# Patient Record
Sex: Female | Born: 1970 | Race: White | Hispanic: No | Marital: Married | Smoking: Never smoker
Health system: Southern US, Community
[De-identification: ages and names within clinical notes are randomized; demographics above are authoritative.]

## PROBLEM LIST (undated history)

## (undated) DIAGNOSIS — D649 Anemia, unspecified: Secondary | ICD-10-CM

## (undated) DIAGNOSIS — R42 Dizziness and giddiness: Secondary | ICD-10-CM

## (undated) DIAGNOSIS — R7303 Prediabetes: Secondary | ICD-10-CM

## (undated) DIAGNOSIS — F419 Anxiety disorder, unspecified: Secondary | ICD-10-CM

## (undated) DIAGNOSIS — F329 Major depressive disorder, single episode, unspecified: Secondary | ICD-10-CM

## (undated) DIAGNOSIS — F32A Depression, unspecified: Secondary | ICD-10-CM

## (undated) DIAGNOSIS — N920 Excessive and frequent menstruation with regular cycle: Secondary | ICD-10-CM

## (undated) DIAGNOSIS — Z8041 Family history of malignant neoplasm of ovary: Secondary | ICD-10-CM

## (undated) DIAGNOSIS — D259 Leiomyoma of uterus, unspecified: Secondary | ICD-10-CM

## (undated) HISTORY — DX: Excessive and frequent menstruation with regular cycle: N92.0

## (undated) HISTORY — DX: Dizziness and giddiness: R42

## (undated) HISTORY — DX: Major depressive disorder, single episode, unspecified: F32.9

## (undated) HISTORY — DX: Family history of malignant neoplasm of ovary: Z80.41

## (undated) HISTORY — DX: Anxiety disorder, unspecified: F41.9

## (undated) HISTORY — PX: WISDOM TOOTH EXTRACTION: SHX21

## (undated) HISTORY — DX: Anemia, unspecified: D64.9

## (undated) HISTORY — DX: Depression, unspecified: F32.A

## (undated) HISTORY — DX: Leiomyoma of uterus, unspecified: D25.9

---
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
## 2004-10-05 ENCOUNTER — Ambulatory Visit: Payer: Self-pay | Admitting: Unknown Physician Specialty

## 2005-10-09 ENCOUNTER — Inpatient Hospital Stay: Payer: Self-pay

## 2006-09-12 HISTORY — PX: HYSTEROSCOPY WITH D & C: SHX1775

## 2007-02-26 ENCOUNTER — Ambulatory Visit: Payer: Self-pay | Admitting: Unknown Physician Specialty

## 2007-03-01 ENCOUNTER — Ambulatory Visit: Payer: Self-pay | Admitting: Unknown Physician Specialty

## 2009-10-07 DIAGNOSIS — G4700 Insomnia, unspecified: Secondary | ICD-10-CM | POA: Insufficient documentation

## 2009-10-07 DIAGNOSIS — F419 Anxiety disorder, unspecified: Secondary | ICD-10-CM | POA: Insufficient documentation

## 2010-01-21 DIAGNOSIS — E559 Vitamin D deficiency, unspecified: Secondary | ICD-10-CM | POA: Insufficient documentation

## 2010-12-06 ENCOUNTER — Ambulatory Visit: Payer: Self-pay | Admitting: Family Medicine

## 2013-04-02 LAB — HM PAP SMEAR: HM Pap smear: NEGATIVE

## 2013-04-26 HISTORY — PX: MM BREAST STEREO BIOPSY LEFT (ARMC HX): HXRAD1824

## 2013-05-27 DIAGNOSIS — R928 Other abnormal and inconclusive findings on diagnostic imaging of breast: Secondary | ICD-10-CM | POA: Insufficient documentation

## 2013-08-02 LAB — HM MAMMOGRAPHY

## 2013-09-12 HISTORY — PX: BREAST BIOPSY: SHX20

## 2015-03-11 DIAGNOSIS — D259 Leiomyoma of uterus, unspecified: Secondary | ICD-10-CM | POA: Insufficient documentation

## 2015-03-11 DIAGNOSIS — R51 Headache: Secondary | ICD-10-CM

## 2015-03-11 DIAGNOSIS — N921 Excessive and frequent menstruation with irregular cycle: Secondary | ICD-10-CM | POA: Insufficient documentation

## 2015-03-11 DIAGNOSIS — R739 Hyperglycemia, unspecified: Secondary | ICD-10-CM | POA: Insufficient documentation

## 2015-03-11 DIAGNOSIS — R519 Headache, unspecified: Secondary | ICD-10-CM | POA: Insufficient documentation

## 2015-07-18 ENCOUNTER — Ambulatory Visit (INDEPENDENT_AMBULATORY_CARE_PROVIDER_SITE_OTHER): Payer: BC Managed Care – PPO | Admitting: Family Medicine

## 2015-07-18 ENCOUNTER — Encounter: Payer: Self-pay | Admitting: Family Medicine

## 2015-07-18 VITALS — BP 128/74 | HR 84 | Temp 98.2°F | Resp 16 | Wt 167.0 lb

## 2015-07-18 DIAGNOSIS — J029 Acute pharyngitis, unspecified: Secondary | ICD-10-CM | POA: Diagnosis not present

## 2015-07-18 LAB — POCT RAPID STREP A (OFFICE): Rapid Strep A Screen: POSITIVE — AB

## 2015-07-18 MED ORDER — AMOXICILLIN 875 MG PO TABS: 875.0000 mg | ORAL_TABLET | Freq: Two times a day (BID) | ORAL | Status: DC

## 2015-07-18 NOTE — Progress Notes (Signed)
Subjective:     Patient ID: Allison Beard, female   DOB: 1971-05-28, 44 y.o.   MRN: [id number]  HPI  Chief Complaint  Patient presents with  . URI  . Sore Throat  . Ear Pain  States her sx started 11/3 associated with fever, chill, and PND. Report she is a [place].   Review of Systems     Objective:   Physical Exam  Constitutional: She appears well-developed and well-nourished. No distress.  Ears: T.M's intact without inflammation Throat: mild tonsillar enlargement and erythema Neck: +anterior cervical nodes Lungs: clear    Assessment:    1. Pharyngitis - POCT rapid strep A - amoxicillin (AMOXIL) 875 MG tablet; Take 1 tablet (875 mg total) by mouth 2 (two) times daily.  Dispense: 20 tablet; Refill: 0    Plan:    Discussed warm salt water gargles.

## 2015-07-18 NOTE — Patient Instructions (Signed)
May use warm salt water gargles

## 2015-07-22 ENCOUNTER — Ambulatory Visit (INDEPENDENT_AMBULATORY_CARE_PROVIDER_SITE_OTHER): Payer: BC Managed Care – PPO | Admitting: Physician Assistant

## 2015-07-22 ENCOUNTER — Encounter: Payer: Self-pay | Admitting: Physician Assistant

## 2015-07-22 VITALS — BP 110/70 | HR 74 | Temp 98.7°F | Resp 16 | Ht 63.0 in | Wt 167.8 lb

## 2015-07-22 DIAGNOSIS — Z1239 Encounter for other screening for malignant neoplasm of breast: Secondary | ICD-10-CM | POA: Diagnosis not present

## 2015-07-22 DIAGNOSIS — Z124 Encounter for screening for malignant neoplasm of cervix: Secondary | ICD-10-CM

## 2015-07-22 DIAGNOSIS — Z0000 Encounter for general adult medical examination without abnormal findings: Secondary | ICD-10-CM

## 2015-07-22 NOTE — Progress Notes (Signed)
Patient: Allison Beard, Female    DOB: [birth year], 44 y.o.   MRN: [id number] Visit Date: 07/22/2015  Today's Provider: Mar Daring, PA-C   Chief Complaint  Patient presents with  . Annual Exam   Subjective:    Annual physical exam Allison Beard is a 44 y.o. female who presents today for health maintenance and complete physical. She feels well. She reports exercising, walks 2-3 time a week. She reports she is sleeping well. Per patient skipped last year wellness exam due to financial constraints. Previous Pap smear was done at [facility] and reported normal. This was done in 2014. At that time she did have a Mirena IUD placed. She does not remember ever having any abnormal Pap smears. There is no family history of cervical or ovarian cancer. She would like to return to [place] for her Pap smear and breast exam. She was previously seen by Dr. Leo Grosser. In 2014 when she had her mammogram it did come back abnormal and she went through a full workup. This was done on the left breast. All workup came back negative. She has her mammograms done at [facility] imaging and would like to return there for her mammograms. There is no known family history of breast cancer. She has never had a colonoscopy. There is no known family history of colon cancer. Patient declined Influenza vaccine.  Last PCP:07/11/14  Review of Systems  Constitutional: Negative.   HENT: Negative.   Eyes: Negative.   Respiratory: Negative.   Cardiovascular: Negative.   Gastrointestinal: Negative.   Endocrine: Positive for heat intolerance.  Genitourinary: Negative.   Musculoskeletal: Negative.   Skin: Negative.   Allergic/Immunologic: Negative.   Neurological: Negative.   Hematological: Negative.   Psychiatric/Behavioral: Negative.     Social History She  reports that she has never smoked. She has never used smokeless tobacco. She reports that she drinks alcohol. She reports that  she does not use illicit drugs. Social History   Social History  . Marital Status: Married    Spouse Name: N/A  . Number of Children: N/A  . Years of Education: N/A   Social History Main Topics  . Smoking status: Never Smoker   . Smokeless tobacco: Never Used  . Alcohol Use: 0.0 oz/week    0 Standard drinks or equivalent per week     Comment: OCCASIONALLY  . Drug Use: No  . Sexual Activity: Not Asked   Other Topics Concern  . None   Social History Narrative    Patient Active Problem List   Diagnosis Date Noted  . Elevated blood sugar 03/11/2015  . Leiomyoma of uterus 03/11/2015  . Cephalalgia 03/11/2015  . Excess, menstruation 03/11/2015  . Abnormal finding on mammography 05/27/2013  . Avitaminosis D 01/21/2010  . Anxiety disorder 10/07/2009  . Cannot sleep 10/07/2009    Past Surgical History  Procedure Laterality Date  . Myomectomy  2009  . Ankle surgery  1989    Family History  Family Status  Relation Status Death Age  . Mother Deceased 27  . Father Alive   . Sister Alive    Her family history includes Bipolar disorder in her mother and sister; Congestive Heart Failure in her mother; Depression in her mother; Diabetes in her mother; Heart disease in her mother.    No Known Allergies  Previous Medications   AMOXICILLIN (AMOXIL) 875 MG TABLET    Take 1 tablet (875 mg total) by mouth 2 (two)  times daily.   FLUOXETINE (PROZAC) 40 MG CAPSULE    Take 1 capsule by mouth daily.   LEVONORGESTREL (MIRENA, 52 MG,) 20 MCG/24HR IUD    MIRENA, 20MCG/24HR (Intrauterine Intrauterine Device)  as directed for 0 days  Quantity: 1.00;  Refills: 0   Ordered :06-Dec-2010  Maurine Minister FNP;  Started 07-Oct-2009 Active    Patient Care Team: Mar Daring, PA-C as PCP - General (Physician Assistant)     Objective:   Vitals: BP 110/70 mmHg  Pulse 74  Temp(Src) 98.7 F (37.1 C) (Oral)  Resp 16  Ht 5\' 3"  (1.6 m)  Wt 167 lb 12.8 oz (76.114 kg)  BMI 29.73  kg/m2  LMP    Physical Exam  Constitutional: She is oriented to person, place, and time. She appears well-developed and well-nourished. No distress.  HENT:  Head: Normocephalic and atraumatic.  Right Ear: External ear normal.  Left Ear: External ear normal.  Nose: Nose normal.  Mouth/Throat: Oropharynx is clear and moist. No oropharyngeal exudate.  Eyes: Conjunctivae and EOM are normal. Pupils are equal, round, and reactive to light. Right eye exhibits no discharge. Left eye exhibits no discharge. No scleral icterus.  Neck: Normal range of motion. Neck supple. No JVD present. No tracheal deviation present. No thyromegaly present.  Cardiovascular: Normal rate, regular rhythm, normal heart sounds and intact distal pulses.  Exam reveals no gallop and no friction rub.   No murmur heard. Pulmonary/Chest: Effort normal and breath sounds normal. No respiratory distress. She has no wheezes. She has no rales. She exhibits no tenderness.  Abdominal: Soft. Bowel sounds are normal. She exhibits no distension and no mass. There is no tenderness. There is no rebound and no guarding.  Musculoskeletal: Normal range of motion. She exhibits no edema or tenderness.  Lymphadenopathy:    She has no cervical adenopathy.  Neurological: She is alert and oriented to person, place, and time.  Skin: Skin is warm and dry. No rash noted. She is not diaphoretic.  Psychiatric: She has a normal mood and affect. Her behavior is normal. Judgment and thought content normal.  Vitals reviewed.    Depression Screen No flowsheet data found.    Assessment & Plan:     Routine Health Maintenance and Physical Exam  1. Annual physical exam Physical exam was normal today. She did defer doing her breast exam and pelvic exam including Pap smear to [facility]. I did make the referral for her to go back to [facility] for this. She has not been seen since 2014 there. We will also check labs as below and follow-up pending  labs. I did also place an order for her mammogram. She will call [place] [facility] to schedule this appointment. I will follow-up with her pending labs. If labs are stable I will see her back in approximately 8 months. At that time she would like to discuss possibly trying Wellbutrin instead of Prozac to see if it will help decrease the side effects she has with the Prozac as well as offer some weight loss benefit. She would like to wait until this time so that she will be out of school that way if she has any adverse reactions to the Wellbutrin it will not be noticed through her employer. - Mammogram Digital Diagnostic Bilateral; Future - CBC with Differential - Comprehensive metabolic panel - Lipid panel - TSH  2. Breast cancer screening She does have a history of abnormal mammogram in 2014 of the left breast. At that time  she did undergo a diagnostic ultrasound, needle biopsy and surgical biopsy. All was normal. Orders have in place for screening digital mammogram as well as a diagnostic mammogram in case the screening digital mammogram comes back abnormal. She is going to be calling [facility] to schedule this appointment. I will follow-up with her pending the results. - Mammogram Digital Diagnostic Bilateral; Future - MM Digital Screening; Future  3. Encounter for screening for cervical cancer  Patient was previously seen at [facility] for her breast exams and pelvic exams. She does currently have an IUD in place. She would like to go back to [place] for her screening Pap smear and breast exam. I did place this referral for her and we'll follow-up once I receive their notes. - Ambulatory referral to Gynecology   Exercise Activities and Dietary recommendations Goals    None      Immunization History  Administered Date(s) Administered  . Tdap 07/26/2011    Health Maintenance  Topic Date Due  . HIV Screening  12/14/1983  . PAP SMEAR  12/13/1989  . INFLUENZA  VACCINE  04/13/2015  . TETANUS/TDAP  07/25/2021      Discussed health benefits of physical activity, and encouraged her to engage in regular exercise appropriate for her age and condition.    --------------------------------------------------------------------

## 2015-07-22 NOTE — Patient Instructions (Signed)
Health Maintenance, Female Adopting a healthy lifestyle and getting preventive care can go a long way to promote health and wellness. Talk with your health care provider about what schedule of regular examinations is right for you. This is a good chance for you to check in with your provider about disease prevention and staying healthy. In between checkups, there are plenty of things you can do on your own. Experts have done a lot of research about which lifestyle changes and preventive measures are most likely to keep you healthy. Ask your health care provider for more information. WEIGHT AND DIET  Eat a healthy diet  Be sure to include plenty of vegetables, fruits, low-fat dairy products, and lean protein.  Do not eat a lot of foods high in solid fats, added sugars, or salt.  Get regular exercise. This is one of the most important things you can do for your health.  Most adults should exercise for at least 150 minutes each week. The exercise should increase your heart rate and make you sweat (moderate-intensity exercise).  Most adults should also do strengthening exercises at least twice a week. This is in addition to the moderate-intensity exercise.  Maintain a healthy weight  Body mass index (BMI) is a measurement that can be used to identify possible weight problems. It estimates body fat based on height and weight. Your health care provider can help determine your BMI and help you achieve or maintain a healthy weight.  For females 46 years of age and older:   A BMI below 18.5 is considered underweight.  A BMI of 18.5 to 24.9 is normal.  A BMI of 25 to 29.9 is considered overweight.  A BMI of 30 and above is considered obese.  Watch levels of cholesterol and blood lipids  You should start having your blood tested for lipids and cholesterol at 46 years of age, then have this test every 5 years.  You may need to have your cholesterol levels checked more often if:  Your lipid  or cholesterol levels are high.  You are older than 46 years of age.  You are at high risk for heart disease.  CANCER SCREENING   Lung Cancer  Lung cancer screening is recommended for adults 75-66 years old who are at high risk for lung cancer because of a history of smoking.  A yearly low-dose CT scan of the lungs is recommended for people who:  Currently smoke.  Have quit within the past 15 years.  Have at least a 30-pack-year history of smoking. A pack year is smoking an average of one pack of cigarettes a day for 1 year.  Yearly screening should continue until it has been 46 years since you quit.  Yearly screening should stop if you develop a health problem that would prevent you from having lung cancer treatment.  Breast Cancer  Practice breast self-awareness. This means understanding how your breasts normally appear and feel.  It also means doing regular breast self-exams. Let your health care provider know about any changes, no matter how small.  If you are in your 20s or 30s, you should have a clinical breast exam (CBE) by a health care provider every 1-3 years as part of a regular health exam.  If you are 25 or older, have a CBE every year. Also consider having a breast X-ray (mammogram) every year.  If you have a family history of breast cancer, talk to your health care provider about genetic screening.  If you  are at high risk for breast cancer, talk to your health care provider about having an MRI and a mammogram every year.  Breast cancer gene (BRCA) assessment is recommended for women who have family members with BRCA-related cancers. BRCA-related cancers include:  Breast.  Ovarian.  Tubal.  Peritoneal cancers.  Results of the assessment will determine the need for genetic counseling and BRCA1 and BRCA2 testing. Cervical Cancer Your health care provider may recommend that you be screened regularly for cancer of the pelvic organs (ovaries, uterus, and  vagina). This screening involves a pelvic examination, including checking for microscopic changes to the surface of your cervix (Pap test). You may be encouraged to have this screening done every 3 years, beginning at age 46.  For women ages 46-65, health care providers may recommend pelvic exams and Pap testing every 3 years, or they may recommend the Pap and pelvic exam, combined with testing for human papilloma virus (HPV), every 5 years. Some types of HPV increase your risk of cervical cancer. Testing for HPV may also be done on women of any age with unclear Pap test results.  Other health care providers may not recommend any screening for nonpregnant women who are considered low risk for pelvic cancer and who do not have symptoms. Ask your health care provider if a screening pelvic exam is right for you.  If you have had past treatment for cervical cancer or a condition that could lead to cancer, you need Pap tests and screening for cancer for at least 20 years after your treatment. If Pap tests have been discontinued, your risk factors (such as having a new sexual partner) need to be reassessed to determine if screening should resume. Some women have medical problems that increase the chance of getting cervical cancer. In these cases, your health care provider may recommend more frequent screening and Pap tests. Colorectal Cancer  This type of cancer can be detected and often prevented.  Routine colorectal cancer screening usually begins at 46 years of age and continues through 46 years of age.  Your health care provider may recommend screening at an earlier age if you have risk factors for colon cancer.  Your health care provider may also recommend using home test kits to check for hidden blood in the stool.  A small camera at the end of a tube can be used to examine your colon directly (sigmoidoscopy or colonoscopy). This is done to check for the earliest forms of colorectal  cancer.  Routine screening usually begins at age 46.  Direct examination of the colon should be repeated every 5-10 years through 46 years of age. However, you may need to be screened more often if early forms of precancerous polyps or small growths are found. Skin Cancer  Check your skin from head to toe regularly.  Tell your health care provider about any new moles or changes in moles, especially if there is a change in a mole's shape or color.  Also tell your health care provider if you have a mole that is larger than the size of a pencil eraser.  Always use sunscreen. Apply sunscreen liberally and repeatedly throughout the day.  Protect yourself by wearing long sleeves, pants, a wide-brimmed hat, and sunglasses whenever you are outside. HEART DISEASE, DIABETES, AND HIGH BLOOD PRESSURE   High blood pressure causes heart disease and increases the risk of stroke. High blood pressure is more likely to develop in:  People who have blood pressure in the high end   of the normal range (130-139/85-89 mm Hg).  People who are overweight or obese.  People who are African American.  If you are 38-23 years of age, have your blood pressure checked every 3-5 years. If you are 61 years of age or older, have your blood pressure checked every year. You should have your blood pressure measured twice--once when you are at a hospital or clinic, and once when you are not at a hospital or clinic. Record the average of the two measurements. To check your blood pressure when you are not at a hospital or clinic, you can use:  An automated blood pressure machine at a pharmacy.  A home blood pressure monitor.  If you are between 45 years and 39 years old, ask your health care provider if you should take aspirin to prevent strokes.  Have regular diabetes screenings. This involves taking a blood sample to check your fasting blood sugar level.  If you are at a normal weight and have a low risk for diabetes,  have this test once every three years after 46 years of age.  If you are overweight and have a high risk for diabetes, consider being tested at a younger age or more often. PREVENTING INFECTION  Hepatitis B  If you have a higher risk for hepatitis B, you should be screened for this virus. You are considered at high risk for hepatitis B if:  You were born in a country where hepatitis B is common. Ask your health care provider which countries are considered high risk.  Your parents were born in a high-risk country, and you have not been immunized against hepatitis B (hepatitis B vaccine).  You have HIV or AIDS.  You use needles to inject street drugs.  You live with someone who has hepatitis B.  You have had sex with someone who has hepatitis B.  You get hemodialysis treatment.  You take certain medicines for conditions, including cancer, organ transplantation, and autoimmune conditions. Hepatitis C  Blood testing is recommended for:  Everyone born from [birth year] through 1967.  Anyone with known risk factors for hepatitis C. Sexually transmitted infections (STIs)  You should be screened for sexually transmitted infections (STIs) including gonorrhea and chlamydia if:  You are sexually active and are younger than 46 years of age.  You are older than 46 years of age and your health care provider tells you that you are at risk for this type of infection.  Your sexual activity has changed since you were last screened and you are at an increased risk for chlamydia or gonorrhea. Ask your health care provider if you are at risk.  If you do not have HIV, but are at risk, it may be recommended that you take a prescription medicine daily to prevent HIV infection. This is called pre-exposure prophylaxis (PrEP). You are considered at risk if:  You are sexually active and do not regularly use condoms or know the HIV status of your partner(s).  You take drugs by injection.  You are sexually  active with a partner who has HIV. Talk with your health care provider about whether you are at high risk of being infected with HIV. If you choose to begin PrEP, you should first be tested for HIV. You should then be tested every 3 months for as long as you are taking PrEP.  PREGNANCY   If you are premenopausal and you may become pregnant, ask your health care provider about preconception counseling.  If you may  become pregnant, take 400 to 800 micrograms (mcg) of folic acid every day.  If you want to prevent pregnancy, talk to your health care provider about birth control (contraception). OSTEOPOROSIS AND MENOPAUSE   Osteoporosis is a disease in which the bones lose minerals and strength with aging. This can result in serious bone fractures. Your risk for osteoporosis can be identified using a bone density scan.  If you are 61 years of age or older, or if you are at risk for osteoporosis and fractures, ask your health care provider if you should be screened.  Ask your health care provider whether you should take a calcium or vitamin D supplement to lower your risk for osteoporosis.  Menopause may have certain physical symptoms and risks.  Hormone replacement therapy may reduce some of these symptoms and risks. Talk to your health care provider about whether hormone replacement therapy is right for you.  HOME CARE INSTRUCTIONS   Schedule regular health, dental, and eye exams.  Stay current with your immunizations.   Do not use any tobacco products including cigarettes, chewing tobacco, or electronic cigarettes.  If you are pregnant, do not drink alcohol.  If you are breastfeeding, limit how much and how often you drink alcohol.  Limit alcohol intake to no more than 1 drink per day for nonpregnant women. One drink equals 12 ounces of beer, 5 ounces of wine, or 1 ounces of hard liquor.  Do not use street drugs.  Do not share needles.  Ask your health care provider for help if  you need support or information about quitting drugs.  Tell your health care provider if you often feel depressed.  Tell your health care provider if you have ever been abused or do not feel safe at home.   This information is not intended to replace advice given to you by your health care provider. Make sure you discuss any questions you have with your health care provider.   Document Released: 03/14/2011 Document Revised: 09/19/2014 Document Reviewed: 07/31/2013 Elsevier Interactive Patient Education [employer].

## 2015-07-24 ENCOUNTER — Telehealth: Payer: Self-pay | Admitting: Physician Assistant

## 2015-07-25 LAB — COMPREHENSIVE METABOLIC PANEL
ALT: 11 IU/L (ref 0–32)
AST: 14 IU/L (ref 0–40)
Albumin/Globulin Ratio: 1.6 (ref 1.1–2.5)
Albumin: 4.2 g/dL (ref 3.5–5.5)
Alkaline Phosphatase: 73 IU/L (ref 39–117)
BUN/Creatinine Ratio: 19 (ref 9–23)
BUN: 13 mg/dL (ref 6–24)
Bilirubin Total: 0.4 mg/dL (ref 0.0–1.2)
CO2: 24 mmol/L (ref 18–29)
Calcium: 9.4 mg/dL (ref 8.7–10.2)
Chloride: 102 mmol/L (ref 97–106)
Creatinine, Ser: 0.67 mg/dL (ref 0.57–1.00)
GFR calc Af Amer: 122 mL/min/{1.73_m2} (ref >59)
GFR calc non Af Amer: 106 mL/min/{1.73_m2} (ref >59)
Globulin, Total: 2.6 g/dL (ref 1.5–4.5)
Glucose: 103 mg/dL — ABNORMAL HIGH (ref 65–99)
Potassium: 5.1 mmol/L (ref 3.5–5.2)
Sodium: 141 mmol/L (ref 136–144)
Total Protein: 6.8 g/dL (ref 6.0–8.5)

## 2015-07-25 LAB — CBC WITH DIFFERENTIAL/PLATELET
Basophils Absolute: 0 10*3/uL (ref 0.0–0.2)
Basos: 0 %
EOS (ABSOLUTE): 0.1 10*3/uL (ref 0.0–0.4)
Eos: 1 %
Hematocrit: 37 % (ref 34.0–46.6)
Hemoglobin: 12.6 g/dL (ref 11.1–15.9)
Immature Grans (Abs): 0 10*3/uL (ref 0.0–0.1)
Immature Granulocytes: 0 %
Lymphocytes Absolute: 2.2 10*3/uL (ref 0.7–3.1)
Lymphs: 30 %
MCH: 31 pg (ref 26.6–33.0)
MCHC: 34.1 g/dL (ref 31.5–35.7)
MCV: 91 fL (ref 79–97)
Monocytes Absolute: 0.4 10*3/uL (ref 0.1–0.9)
Monocytes: 5 %
Neutrophils Absolute: 4.6 10*3/uL (ref 1.4–7.0)
Neutrophils: 64 %
Platelets: 276 10*3/uL (ref 150–379)
RBC: 4.06 x10E6/uL (ref 3.77–5.28)
RDW: 12.9 % (ref 12.3–15.4)
WBC: 7.3 10*3/uL (ref 3.4–10.8)

## 2015-07-25 LAB — LIPID PANEL
Chol/HDL Ratio: 4.9 ratio units — ABNORMAL HIGH (ref 0.0–4.4)
Cholesterol, Total: 207 mg/dL — ABNORMAL HIGH (ref 100–199)
HDL: 42 mg/dL (ref >39)
LDL Calculated: 137 mg/dL — ABNORMAL HIGH (ref 0–99)
Triglycerides: 138 mg/dL (ref 0–149)
VLDL Cholesterol Cal: 28 mg/dL (ref 5–40)

## 2015-07-25 LAB — TSH: TSH: 1.79 u[IU]/mL (ref 0.450–4.500)

## 2015-07-27 ENCOUNTER — Telehealth: Payer: Self-pay

## 2015-07-27 NOTE — Telephone Encounter (Signed)
LMTCB  Thanks,  -Marilene Vath 

## 2015-07-27 NOTE — Telephone Encounter (Signed)
-----   Message from Mar Daring, [place] sent at 07/27/2015  9:04 AM EST ----- All labs are stable and WNL with exception of cholesterol which is borderline elevated.  Total cholesterol was 207 (like below 200) and LDL was 137 (like below 100).  Continue to watch high fat, high cholesterol foods in diet and try to limit.  May also see benefit from taking either 1200mg  fish oil twice daily, or taking red yeast rice supplement.  Both help naturally with cholesterol control.  At this time however, there is no need for prescription cholesterol lowering medication.  Will recheck in one year.

## 2015-07-29 NOTE — Telephone Encounter (Signed)
Patient advised of labs results.  Thanks,  -Joseline 

## 2015-07-29 NOTE — Telephone Encounter (Signed)
LMTCB  Thanks,  -Arlette Schaad 

## 2015-08-21 ENCOUNTER — Other Ambulatory Visit: Payer: Self-pay | Admitting: Physician Assistant

## 2015-08-21 DIAGNOSIS — F411 Generalized anxiety disorder: Secondary | ICD-10-CM

## 2015-08-21 MED ORDER — FLUOXETINE HCL 40 MG PO CAPS: 40.0000 mg | ORAL_CAPSULE | Freq: Every day | ORAL | Status: DC

## 2015-08-21 NOTE — Telephone Encounter (Signed)
Patient requesting refill  Thanks

## 2015-08-21 NOTE — Telephone Encounter (Signed)
Pt contacted office for refill request on the following medications: FLUoxetine (PROZAC) 40 MG capsule to [facility]. In [place]. Pt stated she will be out of the medication Monday. Thanks TNP

## 2016-01-16 ENCOUNTER — Encounter: Payer: Self-pay | Admitting: Family Medicine

## 2016-01-16 ENCOUNTER — Ambulatory Visit (INDEPENDENT_AMBULATORY_CARE_PROVIDER_SITE_OTHER): Payer: BC Managed Care – PPO | Admitting: Family Medicine

## 2016-01-16 VITALS — BP 108/62 | HR 61 | Temp 97.7°F | Resp 16 | Wt 174.0 lb

## 2016-01-16 DIAGNOSIS — Z2089 Contact with and (suspected) exposure to other communicable diseases: Secondary | ICD-10-CM

## 2016-01-16 DIAGNOSIS — Z20818 Contact with and (suspected) exposure to other bacterial communicable diseases: Secondary | ICD-10-CM

## 2016-01-16 DIAGNOSIS — J069 Acute upper respiratory infection, unspecified: Secondary | ICD-10-CM | POA: Diagnosis not present

## 2016-01-16 LAB — POCT RAPID STREP A (OFFICE): Rapid Strep A Screen: NEGATIVE

## 2016-01-16 NOTE — Progress Notes (Signed)
Patient: Allison Beard Female    DOB: [birth year]   45 y.o.   MRN: [id number] Visit Date: 01/16/2016  Today's Provider: Lelon Huh, MD   Chief Complaint  Patient presents with  . Sore Throat   Subjective:    Sore Throat  This is a new problem. The current episode started yesterday. The problem has been unchanged. Neither side of throat is experiencing more pain than the other. There has been no fever. The pain is mild. Associated symptoms include congestion, coughing, a hoarse voice and a plugged ear sensation. Pertinent negatives include no abdominal pain, diarrhea, drooling, ear discharge, ear pain, headaches, neck pain, shortness of breath, trouble swallowing or vomiting. She has had exposure to strep (daughter tested positive for strep this week). She has tried nothing for the symptoms.   She states her daughter was ill earlier this week. She had a negative rapid test, but was called today stating her strep culture was positive. She has not yet started antibiotics, but is still feeling much better.     No Known Allergies Previous Medications   FLUOXETINE (PROZAC) 40 MG CAPSULE    Take 1 capsule (40 mg total) by mouth daily.   LEVONORGESTREL (MIRENA, 52 MG,) 20 MCG/24HR IUD    MIRENA, 20MCG/24HR (Intrauterine Intrauterine Device)  as directed for 0 days  Quantity: 1.00;  Refills: 0   Ordered :06-Dec-2010  Maurine Minister FNP;  Started 07-Oct-2009 Active    Review of Systems  Constitutional: Negative for fever, chills, diaphoresis, appetite change and fatigue.  HENT: Positive for congestion, hoarse voice and sore throat. Negative for drooling, ear discharge, ear pain, hearing loss, mouth sores, nosebleeds, postnasal drip, sinus pressure, sneezing and trouble swallowing.   Respiratory: Positive for cough. Negative for chest tightness, shortness of breath and wheezing.   Cardiovascular: Negative for chest pain and palpitations.  Gastrointestinal: Negative for nausea,  vomiting, abdominal pain and diarrhea.  Musculoskeletal: Negative for neck pain.  Neurological: Negative for dizziness, weakness, light-headedness and headaches.    Social History  Substance Use Topics  . Smoking status: Never Smoker   . Smokeless tobacco: Never Used  . Alcohol Use: 0.0 oz/week    0 Standard drinks or equivalent per week     Comment: OCCASIONALLY   Objective:   BP 108/62 mmHg  Pulse 61  Temp(Src) 97.7 F (36.5 C) (Oral)  Resp 16  Wt 174 lb (78.926 kg)  SpO2 99%  Physical Exam  General Appearance:    Alert, cooperative, no distress  HENT:   bilateral TM normal without fluid or infection, neck without nodes, pharynx erythematous without exudate, sinuses nontender, nasal mucosa congested and nasal mucosa pale and congested  Eyes:    PERRL, conjunctiva/corneas clear, EOM's intact       Lungs:     Clear to auscultation bilaterally, respirations unlabored  Heart:    Regular rate and rhythm  Neurologic:   Awake, alert, oriented x 3. No apparent focal neurological           defect.       Results for orders placed or performed in visit on 01/16/16  POCT rapid strep A  Result Value Ref Range   Rapid Strep A Screen Negative Negative         Assessment & Plan:     1. Upper respiratory infection  - POCT rapid strep A  2. Strep throat exposure (daughter) No sign of bacterial infection at this time. Advised to call  for antibiotic prescription if she develops fever over 101, or worsening throat pain.         Lelon Huh, MD  [facility]

## 2016-02-29 ENCOUNTER — Ambulatory Visit: Payer: BC Managed Care – PPO | Admitting: Physician Assistant

## 2016-03-04 ENCOUNTER — Other Ambulatory Visit: Payer: Self-pay | Admitting: Physician Assistant

## 2016-03-04 DIAGNOSIS — F32A Depression, unspecified: Secondary | ICD-10-CM

## 2016-03-04 DIAGNOSIS — F329 Major depressive disorder, single episode, unspecified: Secondary | ICD-10-CM

## 2016-04-19 NOTE — Telephone Encounter (Signed)
error 

## 2016-10-03 ENCOUNTER — Encounter: Payer: Self-pay | Admitting: Physician Assistant

## 2016-10-03 ENCOUNTER — Ambulatory Visit (INDEPENDENT_AMBULATORY_CARE_PROVIDER_SITE_OTHER): Payer: BC Managed Care – PPO | Admitting: Physician Assistant

## 2016-10-03 VITALS — BP 118/70 | HR 80 | Temp 98.1°F | Resp 16 | Wt 174.0 lb

## 2016-10-03 DIAGNOSIS — H6123 Impacted cerumen, bilateral: Secondary | ICD-10-CM

## 2016-10-03 DIAGNOSIS — F32A Depression, unspecified: Secondary | ICD-10-CM

## 2016-10-03 DIAGNOSIS — F329 Major depressive disorder, single episode, unspecified: Secondary | ICD-10-CM

## 2016-10-03 MED ORDER — FLUOXETINE HCL 40 MG PO CAPS: ORAL_CAPSULE | ORAL | 3 refills | Status: DC

## 2016-10-03 NOTE — Patient Instructions (Signed)
Earwax Buildup Your ears make a substance called earwax. It may also be called cerumen. Sometimes, too much earwax builds up in your ear canal. This can cause ear pain and make it harder for you to hear. CAUSES This condition is caused by too much earwax production or buildup. RISK FACTORS The following factors may make you more likely to develop this condition:  Cleaning your ears often with swabs.  Having narrow ear canals.  Having earwax that is overly thick or sticky.  Having eczema.  Being dehydrated. SYMPTOMS Symptoms of this condition include:  Reduced hearing.  Ear drainage.  Ear pain.  Ear itch.  A feeling of fullness in the ear or feeling that the ear is plugged.  Ringing in the ear.  Coughing. DIAGNOSIS Your health care provider can diagnose this condition based on your symptoms and medical history. Your health care provider will also do an ear exam to look inside your ear with a scope (otoscope). You may also have a hearing test. TREATMENT Treatment for this condition includes:  Over-the-counter or prescription ear drops to soften the earwax.  Earwax removal by a health care provider. This may be done:  By flushing the ear with body-temperature water.  With a medical instrument that has a loop at the end (earwax curette).  With a suction device. HOME CARE INSTRUCTIONS  Take over-the-counter and prescription medicines only as told by your health care provider.  Do not put any objects, including an ear swab, into your ear. You can clean the opening of your ear canal with a washcloth.  Drink enough water to keep your urine clear or pale yellow.  If you have frequent earwax buildup or you use hearing aids, consider seeing your health care provider every 6-12 months for routine preventive ear cleanings. Keep all follow-up visits as told by your health care provider. SEEK MEDICAL CARE IF:  You have ear pain.  Your condition does not improve with  treatment.  You have hearing loss.  You have blood, pus, or other fluid coming from your ear. This information is not intended to replace advice given to you by your health care provider. Make sure you discuss any questions you have with your health care provider. Document Released: 10/06/2004 Document Revised: 12/21/2015 Document Reviewed: 04/15/2015 Elsevier Interactive Patient Education  2017 Elsevier Inc.  

## 2016-10-03 NOTE — Progress Notes (Signed)
       Patient: Allison Beard Female    DOB: [birth year]   46 y.o.   MRN: [id number] Visit Date: 10/03/2016  Today's Provider: Mar Daring, PA-C   No chief complaint on file.  Subjective:    HPI Patient is here for Medication Refill on Fluoxetine. Per patient the anxiety is stable. No panic attacks.  She is also concern about her ears feeling plugged in. This started yesterday where she all of a sudden felt as if she were in a tunnel. No tinnitus. No dizziness. No pain.    No Known Allergies   Current Outpatient Prescriptions:  .  FLUoxetine (PROZAC) 40 MG capsule, TAKE 1 CAPSULE(40 MG) BY MOUTH DAILY, Disp: 30 capsule, Rfl: 6 .  levonorgestrel (MIRENA, 52 MG,) 20 MCG/24HR IUD, MIRENA, 20MCG/24HR (Intrauterine Intrauterine Device)  as directed for 0 days  Quantity: 1.00;  Refills: 0   Ordered :06-Dec-2010  Maurine Minister FNP;  Started 07-Oct-2009 Active, Disp: , Rfl:   Review of Systems  Constitutional: Negative for chills, fatigue and fever.  HENT: Positive for hearing loss (sudden feeling of tunneling). Negative for ear pain.   Respiratory: Negative for cough, chest tightness, shortness of breath and wheezing.   Cardiovascular: Negative for chest pain, palpitations and leg swelling.  Gastrointestinal: Negative for abdominal pain.  Neurological: Negative.     Social History  Substance Use Topics  . Smoking status: Never Smoker  . Smokeless tobacco: Never Used  . Alcohol use 0.0 oz/week     Comment: OCCASIONALLY   Objective:   BP 118/70 (BP Location: Right Arm, Patient Position: Sitting, Cuff Size: Normal)   Pulse 80   Temp 98.1 F (36.7 C) (Oral)   Resp 16   Wt 174 lb (78.9 kg)   BMI 30.82 kg/m   Physical Exam  Constitutional: She appears well-developed and well-nourished. No distress.  HENT:  Head: Normocephalic and atraumatic.  Right Ear: External ear normal.  Left Ear: Hearing, tympanic membrane, external ear and ear canal normal.  Nose: Nose  normal.  Mouth/Throat: Oropharynx is clear and moist. No oropharyngeal exudate.  Bilateral cerumen impaction noted. Lavage performed bilaterally. Successful in left ear and TM normal. Right ear unsuccessful.  Neck: Normal range of motion. Neck supple. No tracheal deviation present. No thyromegaly present.  Cardiovascular: Normal rate, regular rhythm and normal heart sounds.  Exam reveals no gallop and no friction rub.   No murmur heard. Pulmonary/Chest: Effort normal and breath sounds normal. No respiratory distress. She has no wheezes. She has no rales.  Musculoskeletal: She exhibits no edema.  Lymphadenopathy:    She has no cervical adenopathy.  Skin: She is not diaphoretic.  Vitals reviewed.     Assessment & Plan:     1. Depression, unspecified depression type Stable. Diagnosis pulled for medication refill. Continue current medical treatment plan. - FLUoxetine (PROZAC) 40 MG capsule; TAKE 1 CAPSULE(40 MG) BY MOUTH DAILY  Dispense: 90 capsule; Refill: 3  2. Bilateral impacted cerumen Lavage in left successful. Right unsuccessful. Advised to start debrox drops. No q tips. She is to call if not successfully removed in one week for repeat lavage. - Ear Lavage       Mar Daring, PA-C  [facility]

## 2016-11-10 ENCOUNTER — Ambulatory Visit: Payer: Self-pay | Admitting: Certified Nurse Midwife

## 2016-12-07 ENCOUNTER — Ambulatory Visit (INDEPENDENT_AMBULATORY_CARE_PROVIDER_SITE_OTHER): Payer: BC Managed Care – PPO | Admitting: Certified Nurse Midwife

## 2016-12-07 ENCOUNTER — Encounter: Payer: Self-pay | Admitting: Certified Nurse Midwife

## 2016-12-07 VITALS — BP 102/62 | HR 69 | Ht 63.0 in | Wt 172.0 lb

## 2016-12-07 DIAGNOSIS — N939 Abnormal uterine and vaginal bleeding, unspecified: Secondary | ICD-10-CM

## 2016-12-07 DIAGNOSIS — N852 Hypertrophy of uterus: Secondary | ICD-10-CM | POA: Diagnosis not present

## 2016-12-07 DIAGNOSIS — Z124 Encounter for screening for malignant neoplasm of cervix: Secondary | ICD-10-CM | POA: Diagnosis not present

## 2016-12-07 DIAGNOSIS — Z01419 Encounter for gynecological examination (general) (routine) without abnormal findings: Secondary | ICD-10-CM | POA: Diagnosis not present

## 2016-12-07 LAB — POCT URINE PREGNANCY: Preg Test, Ur: NEGATIVE

## 2016-12-07 NOTE — Progress Notes (Signed)
Gynecology Annual Exam  PCP: Mar Daring, PA-C  Chief Complaint:  Chief Complaint  Patient presents with  . Gynecologic Exam    discuss bleeding with IUD    History of Present Illness: Patient is a 46 y.o. G3P3003 presents for annual exam. The patient also complains of intermittent bleeding that lasts 2-3 weeks for the past few months. She had her Mirena replaced in July 2014 after which she would have monthly spotting for a day or two, but in the last few months the bleeding is heavier and intermittent for weeks out of a month. She denies dysmenorrhea, lower abdominal pain/pressure. She also complains of episodes of dizziness which began the end of January.  LMP: Patient's last menstrual period was 11/28/2016 (approximate).   Past medical history includes having a D&C and hysteroscopy for an endometrial polyp in 2008. Her PCP at [facility]  is treating her for anxiety with Prozac.Since her last annual 04/02/2013, she had a left breast biopsy for a Birads 4 mammogram 04/24/2013 (calcifications) at [facility] which was benign. There is no family history of breast cancer. She does not do self breast exams. Last Pap smear was 04/02/2013 and was NIL/neg. She has no history of abnormal Pap smears.  She does not use tobacco products. She drinks alcohol occasionally. She does exercise occasionally. She does get adequate calcium in her diet. She had a cholesterol screen 07/2015 and were borderline   Review of Systems: Review of Systems  Constitutional: Negative for chills, fever and weight loss.  HENT: Negative for congestion, sinus pain and sore throat.   Eyes: Negative for blurred vision and pain.  Respiratory: Negative for hemoptysis, shortness of breath and wheezing.   Cardiovascular: Negative for chest pain, palpitations and leg swelling.  Gastrointestinal: Negative for abdominal pain, blood in stool, diarrhea, heartburn, nausea and vomiting.  Genitourinary: Negative for dysuria,  frequency, hematuria and urgency.       Positive for metrorrhagia, negative for dysmenorrhea  Musculoskeletal: Negative for back pain, joint pain and myalgias.  Skin: Negative for itching and rash.  Neurological: Positive for dizziness. Negative for tingling and headaches.  Endo/Heme/Allergies: Negative for environmental allergies and polydipsia. Does not bruise/bleed easily.       Negative for hirsutism   Psychiatric/Behavioral: Negative for depression. The patient is not nervous/anxious and does not have insomnia.     Past Medical History:  Past Medical History:  Diagnosis Date  . Anxiety   . Dizziness      Past Surgical History:  Past Surgical History:  Procedure Laterality Date  . HYSTEROSCOPY W/D&C  2008   endometrial polyp  . MM BREAST STEREO BIOPSY LEFT ([place] HX)  04/26/2013  . ORIF ANKLE FRACTURE Right 1989  . WISDOM TOOTH EXTRACTION     Obstetric History: [id number]  Family History:  Family History  Problem Relation Age of Onset  . Congestive Heart Failure Mother   . Depression Mother   . Diabetes Mother   . Bipolar disorder Mother   . Heart disease Mother   . Hypertension Mother   . Psychiatric Illness Father   . Bipolar disorder Sister   . Lung cancer Maternal Aunt   . Lung cancer Maternal Aunt   . Heart disease Maternal Uncle     Social History:  Social History   Social History  . Marital status: Married    Spouse name: N/A  . Number of children: 3  . Years of education: 42   Occupational History  .  teacher    Social History Main Topics  . Smoking status: Never Smoker  . Smokeless tobacco: Never Used  . Alcohol use 0.0 oz/week     Comment: OCCASIONALLY  . Drug use: No  . Sexual activity: Yes    Partners: Male    Birth control/ protection: IUD   Other Topics Concern  . Not on file   Social History Narrative  . No narrative on file    Allergies:  No Known Allergies  Medications: Prior to Admission medications   Medication Sig Start  Date End Date Taking? Authorizing Provider  FLUoxetine (PROZAC) 40 MG capsule TAKE 1 CAPSULE(40 MG) BY MOUTH DAILY 10/03/16   Mar Daring, PA-C  levonorgestrel (MIRENA, 52 MG,) 20 MCG/24HR IUD inserted 04/02/13 10/07/09   Historical Provider, MD    Physical Exam Vitals: Blood pressure 102/62, pulse 69, height 5\' 3"  (1.6 m), weight 78 kg (172 lb), last menstrual period 11/28/2016.  General: NAD HEENT: normocephalic, anicteric Thyroid: no enlargement, no palpable nodules Pulmonary: No increased work of breathing, CTAB Cardiovascular: RRR without murmur Breast: Breast symmetrical, no tenderness, no palpable nodules or masses, no skin or nipple retraction present, no nipple discharge.  No axillary, infraclavicular, or supraclavicular lymphadenopathy. Abdomen: soft, non-tender, non-distended.  Umbilicus without lesions.  No hepatomegaly. Uterus palpable about half way between SP and U. No evidence of hernia  Genitourinary:  External: Normal external female genitalia.  Normal urethral meatus, normal Bartholin's and Skene's glands.    Vagina: Normal vaginal mucosa, no evidence of prolapse.    Cervix: extremely posterior cervix, unable to visualize entire cx and unable to palpate IUD strings.   Uterus: Enlarged-12 week size, immobile, NT  Adnexa: unable to evaluate due to enlarged uterus  Rectal: deferred  Lymphatic: no evidence of inguinal lymphadenopathy Extremities: no edema, erythema, or tenderness Neurologic: Grossly intact Psychiatric: mood appropriate, affect full  Results for orders placed or performed in visit on 12/07/16 (from the past 24 hour(s))  POCT urine pregnancy     Status: Normal   Collection Time: 12/07/16  4:46 PM  Result Value Ref Range   Preg Test, Ur Negative Negative      Assessment: 46 y.o. [id number] with metrorrhagia Enlarged uterus-probable fibroids Missing IUD strings vs unable to palpate  Plan: Problem List Items Addressed This Visit    None      Visit Diagnoses    Abnormal uterine bleeding    -  Primary   Relevant Orders   POCT urine pregnancy (Completed)   US Transvaginal Non-OB   Encounter for gynecological examination       Relevant Orders   IGP, Aptima HPV   Encounter for screening for cervical cancer        Relevant Orders   IGP, Aptima HPV   Enlarged uterus       Relevant Orders   US Transvaginal Non-OB      1) Mammogram -patient to schedule screening mammogram at [facility]  2) Pap smear-done  3) Schedule ultrasound and FU with me in 1-2 weeks. Get CBC at that time.   Dalia Heading, CNM

## 2016-12-12 LAB — IGP, APTIMA HPV
HPV Aptima: NEGATIVE
PAP Smear Comment: 0

## 2016-12-15 ENCOUNTER — Encounter: Payer: Self-pay | Admitting: Physician Assistant

## 2016-12-15 ENCOUNTER — Ambulatory Visit (INDEPENDENT_AMBULATORY_CARE_PROVIDER_SITE_OTHER): Payer: BC Managed Care – PPO | Admitting: Physician Assistant

## 2016-12-15 VITALS — BP 118/70 | HR 64 | Temp 99.0°F | Resp 16 | Wt 172.0 lb

## 2016-12-15 DIAGNOSIS — N939 Abnormal uterine and vaginal bleeding, unspecified: Secondary | ICD-10-CM | POA: Diagnosis not present

## 2016-12-15 DIAGNOSIS — R42 Dizziness and giddiness: Secondary | ICD-10-CM | POA: Diagnosis not present

## 2016-12-15 DIAGNOSIS — R739 Hyperglycemia, unspecified: Secondary | ICD-10-CM | POA: Diagnosis not present

## 2016-12-15 NOTE — Patient Instructions (Signed)
Dizziness Dizziness is a common problem. It is a feeling of unsteadiness or light-headedness. You may feel like you are about to faint. Dizziness can lead to injury if you stumble or fall. Anyone can become dizzy, but dizziness is more common in older adults. This condition can be caused by a number of things, including medicines, dehydration, or illness. Follow these instructions at home: Taking these steps may help with your condition: Eating and drinking   Drink enough fluid to keep your urine clear or pale yellow. This helps to keep you from becoming dehydrated. Try to drink more clear fluids, such as water.  Do not drink alcohol.  Limit your caffeine intake if directed by your health care provider.  Limit your salt intake if directed by your health care provider. Activity   Avoid making quick movements.  Rise slowly from chairs and steady yourself until you feel okay.  In the morning, first sit up on the side of the bed. When you feel okay, stand slowly while you hold onto something until you know that your balance is fine.  Move your legs often if you need to stand in one place for a long time. Tighten and relax your muscles in your legs while you are standing.  Do not drive or operate heavy machinery if you feel dizzy.  Avoid bending down if you feel dizzy. Place items in your home so that they are easy for you to reach without leaning over. Lifestyle   Do not use any tobacco products, including cigarettes, chewing tobacco, or electronic cigarettes. If you need help quitting, ask your health care provider.  Try to reduce your stress level, such as with yoga or meditation. Talk with your health care provider if you need help. General instructions   Watch your dizziness for any changes.  Take medicines only as directed by your health care provider. Talk with your health care provider if you think that your dizziness is caused by a medicine that you are taking.  Tell a friend  or a family member that you are feeling dizzy. If he or she notices any changes in your behavior, have this person call your health care provider.  Keep all follow-up visits as directed by your health care provider. This is important. Contact a health care provider if:  Your dizziness does not go away.  Your dizziness or light-headedness gets worse.  You feel nauseous.  You have reduced hearing.  You have new symptoms.  You are unsteady on your feet or you feel like the room is spinning. Get help right away if:  You vomit or have diarrhea and are unable to eat or drink anything.  You have problems talking, walking, swallowing, or using your arms, hands, or legs.  You feel generally weak.  You are not thinking clearly or you have trouble forming sentences. It may take a friend or family member to notice this.  You have chest pain, abdominal pain, shortness of breath, or sweating.  Your vision changes.  You notice any bleeding.  You have a headache.  You have neck pain or a stiff neck.  You have a fever. This information is not intended to replace advice given to you by your health care provider. Make sure you discuss any questions you have with your health care provider. Document Released: 02/22/2001 Document Revised: 02/04/2016 Document Reviewed: 08/25/2014 Elsevier Interactive Patient Education  2017 Elsevier Inc.  

## 2016-12-15 NOTE — Progress Notes (Signed)
Patient: Allison Beard Female    DOB: 04-Apr-1971   46 y.o.   MRN: [id number] Visit Date: 12/15/2016  Today's Provider: Trinna Post, PA-C   Chief Complaint  Patient presents with  . Dizziness   Subjective:    Dizziness  This is a recurrent problem. The current episode started more than 1 month ago. The problem occurs intermittently (It has happened three times.). The problem has been waxing and waning. Associated symptoms include nausea. Pertinent negatives include no chest pain, fatigue, fever, headaches, vertigo, visual change, vomiting or weakness. Nothing aggravates the symptoms. She has tried rest for the symptoms. The treatment provided mild relief.   Patient reports that she has had 3 episodes within 1 month or so of dizziness. Patient reports that nothing in particular triggers the episodes, they just happen. One happened when she was standing in a supermarket. She felt lightheaded and a little bit nauseated and had to sit down. Others happened when she's sitting down. She denies triggers from changing positions, moving her head. No CP, blurred vision, headaches, tinnitus, balance issues.   Sees Cay Schillings, CNM at [facility] for her [facility]. She previously had a Mirena IUD but on her recent visits strings were not visualized and not felt. She is to have an [place] to look for IUD soon. She is also undergoing workup for possible uterine fibroids as her uterus was felt to be enlarged on previous exams. She has had abnormal uterine bleeding since then, intermittently.  She also has a history of elevated fasting glucose on previous CMET in 2016.    No Known Allergies   Current Outpatient Prescriptions:  .  FLUoxetine (PROZAC) 40 MG capsule, TAKE 1 CAPSULE(40 MG) BY MOUTH DAILY, Disp: 90 capsule, Rfl: 3 .  levonorgestrel (MIRENA, 52 MG,) 20 MCG/24HR IUD, inserted 04/02/13, Disp: , Rfl:   Review of Systems  Constitutional: Negative for fatigue and fever.    Cardiovascular: Negative for chest pain.  Gastrointestinal: Positive for nausea. Negative for vomiting.  Neurological: Positive for dizziness. Negative for vertigo, weakness and headaches.    Social History  Substance Use Topics  . Smoking status: Never Smoker  . Smokeless tobacco: Never Used  . Alcohol use 0.0 oz/week     Comment: OCCASIONALLY   Objective:   BP 118/70 (BP Location: Left Arm, Patient Position: Sitting, Cuff Size: Normal)   Pulse 64   Temp 99 F (37.2 C)   Resp 16   Wt 172 lb (78 kg)   LMP 11/28/2016 (Approximate) Comment: bleeding off and of with IUD  SpO2 97%   BMI 30.47 kg/m  Vitals:   12/15/16 0907  BP: 118/70  Pulse: 64  Resp: 16  Temp: 99 F (37.2 C)  SpO2: 97%  Weight: 172 lb (78 kg)     Physical Exam  Constitutional: She is oriented to person, place, and time. She appears well-developed and well-nourished. No distress.  HENT:  Right Ear: Tympanic membrane and external ear normal.  Left Ear: Tympanic membrane and external ear normal.  Eyes: Conjunctivae and EOM are normal. Pupils are equal, round, and reactive to light.  Neck: Neck supple.  Cardiovascular: Normal rate, regular rhythm and normal heart sounds.   Pulmonary/Chest: Effort normal and breath sounds normal. No respiratory distress. She has no wheezes.  Abdominal: Soft. Bowel sounds are normal.  Lymphadenopathy:    She has no cervical adenopathy.  Neurological: She is alert and oriented to person, place, and time. No  cranial nerve deficit. Coordination and gait normal.  Skin: Skin is warm and dry.  Psychiatric: She has a normal mood and affect. Her behavior is normal.        Assessment & Plan:     1. Dizziness  Etiology unclear. Does not sounds like vertigo. Does not sound cardiac. No focal neuro deficits today. Will check CMET for electrolytes and blood sugar, as well as CBC for anemia in light of abnormal uterine bleeding. I have reviewed the note/history from patient's  12/07/2016 visit with OBGYN.  - Comprehensive metabolic panel - CBC with Differential/Platelet  2. Abnormal uterine bleeding  - CBC with Differential/Platelet  3. Elevated blood sugar  - Comprehensive metabolic panel - Hemoglobin A1c  g      Trinna Post, PA-C  [facility]

## 2016-12-16 LAB — COMPREHENSIVE METABOLIC PANEL
ALT: 11 IU/L (ref 0–32)
AST: 16 IU/L (ref 0–40)
Albumin/Globulin Ratio: 1.4 (ref 1.2–2.2)
Albumin: 4 g/dL (ref 3.5–5.5)
Alkaline Phosphatase: 69 IU/L (ref 39–117)
BUN/Creatinine Ratio: 13 (ref 9–23)
BUN: 9 mg/dL (ref 6–24)
Bilirubin Total: <0.2 mg/dL (ref 0.0–1.2)
CO2: 26 mmol/L (ref 18–29)
Calcium: 9.9 mg/dL (ref 8.7–10.2)
Chloride: 100 mmol/L (ref 96–106)
Creatinine, Ser: 0.72 mg/dL (ref 0.57–1.00)
GFR calc Af Amer: 115 mL/min/{1.73_m2} (ref >59)
GFR calc non Af Amer: 99 mL/min/{1.73_m2} (ref >59)
Globulin, Total: 2.9 g/dL (ref 1.5–4.5)
Glucose: 94 mg/dL (ref 65–99)
Potassium: 4.8 mmol/L (ref 3.5–5.2)
Sodium: 139 mmol/L (ref 134–144)
Total Protein: 6.9 g/dL (ref 6.0–8.5)

## 2016-12-16 LAB — CBC WITH DIFFERENTIAL/PLATELET
Basophils Absolute: 0 10*3/uL (ref 0.0–0.2)
Basos: 0 %
EOS (ABSOLUTE): 0.1 10*3/uL (ref 0.0–0.4)
Eos: 1 %
Hematocrit: 38.3 % (ref 34.0–46.6)
Hemoglobin: 12.7 g/dL (ref 11.1–15.9)
Immature Grans (Abs): 0 10*3/uL (ref 0.0–0.1)
Immature Granulocytes: 0 %
Lymphocytes Absolute: 1.6 10*3/uL (ref 0.7–3.1)
Lymphs: 23 %
MCH: 30.2 pg (ref 26.6–33.0)
MCHC: 33.2 g/dL (ref 31.5–35.7)
MCV: 91 fL (ref 79–97)
Monocytes Absolute: 0.4 10*3/uL (ref 0.1–0.9)
Monocytes: 5 %
Neutrophils Absolute: 5 10*3/uL (ref 1.4–7.0)
Neutrophils: 71 %
Platelets: 244 10*3/uL (ref 150–379)
RBC: 4.21 x10E6/uL (ref 3.77–5.28)
RDW: 13.3 % (ref 12.3–15.4)
WBC: 7.1 10*3/uL (ref 3.4–10.8)

## 2016-12-16 LAB — HEMOGLOBIN A1C
Est. average glucose Bld gHb Est-mCnc: 123 mg/dL
Hgb A1c MFr Bld: 5.9 % — ABNORMAL HIGH (ref 4.8–5.6)

## 2016-12-19 NOTE — Progress Notes (Signed)
Advised  ED 

## 2016-12-30 ENCOUNTER — Encounter: Payer: Self-pay | Admitting: Certified Nurse Midwife

## 2016-12-30 ENCOUNTER — Ambulatory Visit (INDEPENDENT_AMBULATORY_CARE_PROVIDER_SITE_OTHER): Payer: BC Managed Care – PPO | Admitting: Certified Nurse Midwife

## 2016-12-30 ENCOUNTER — Ambulatory Visit (INDEPENDENT_AMBULATORY_CARE_PROVIDER_SITE_OTHER): Payer: BC Managed Care – PPO

## 2016-12-30 VITALS — BP 110/70 | HR 57 | Ht 63.0 in | Wt 172.0 lb

## 2016-12-30 DIAGNOSIS — Z975 Presence of (intrauterine) contraceptive device: Secondary | ICD-10-CM | POA: Diagnosis not present

## 2016-12-30 DIAGNOSIS — N852 Hypertrophy of uterus: Secondary | ICD-10-CM | POA: Diagnosis not present

## 2016-12-30 DIAGNOSIS — N939 Abnormal uterine and vaginal bleeding, unspecified: Secondary | ICD-10-CM

## 2016-12-30 DIAGNOSIS — D252 Subserosal leiomyoma of uterus: Secondary | ICD-10-CM | POA: Diagnosis not present

## 2016-12-30 DIAGNOSIS — N921 Excessive and frequent menstruation with irregular cycle: Secondary | ICD-10-CM

## 2016-12-30 DIAGNOSIS — N83202 Unspecified ovarian cyst, left side: Secondary | ICD-10-CM | POA: Diagnosis not present

## 2016-12-30 DIAGNOSIS — D251 Intramural leiomyoma of uterus: Secondary | ICD-10-CM | POA: Diagnosis not present

## 2016-12-30 NOTE — Progress Notes (Signed)
  HPI: Allison Beard is a 46 year old [id number] who presents for a conference after an ultrasound that was ordered for an enlarged uterus and metrorrhagia. She has a Mirena IUD( last one inserted 4 years ago) and recently has been having prolonged intermittent bleeding.  Her recent Pap smear was NIL/neg HRHPV and her CBC was also normal. Increased bleeding on 13 and 14 April: ?LMP. She has a past history of a hysteroscopy and D&C for endometrial polyp in 2008  Ultrasound demonstrates two fibroids: the largest is posterior and measures 53.99x 38.91 cm and the smaller fibroid is 27.19 x 28.53 cm and is anterior. Both seem to be IM or subserosal.. Endometrium is 7.13 mm thick. There is a simple 3.6 x 4.0 cm cyst on the left ovary. There appear to be some Nabothian cysts on the cervix. The IUD is in place.   PMHx: She  has a past medical history of Anxiety and Dizziness. Also,  has a past surgical history that includes MM BREAST STEREO BIOPSY LEFT ([facility] HX) (04/26/2013); Hysteroscopy w/D&C (2008); Wisdom tooth extraction; and ORIF ankle fracture (Right, 1989)., family history includes Bipolar disorder in her mother and sister; Congestive Heart Failure in her mother; Depression in her mother; Diabetes in her mother; Heart disease in her maternal uncle and mother; Hypertension in her mother; Lung cancer in her maternal aunt and maternal aunt; Psychiatric Illness in her father.,  reports that she has never smoked. She has never used smokeless tobacco. She reports that she drinks alcohol. She reports that she does not use drugs.  She has a current medication list which includes the following prescription(s): fluoxetine and levonorgestrel. Also, has No Known Allergies.  ROS  Objective: BP 110/70   Pulse (!) 57   Ht 5\' 3"  (1.6 m)   Wt 172 lb (78 kg)   BMI 30.47 kg/m   Physical examination Constitutional NAD, Conversant  Skin No rashes, lesions or ulceration.   Extremities: Moves all appropriately.  Normal ROM  for age. No lymphadenopathy.  Neuro: Grossly intact  Psych: Oriented to PPT.  Normal mood. Normal affect.   Assessment:  Intramural and subserous leiomyoma of uterus  Metrorrhagia  IUD (intrauterine device) in place   Left ovarian cyst  Plan: Discussed the findings with patient. Not symptomatic of uterine fibroids or ovarian cyst. Most likely her fibroids are not the cause of her metrorrhagia, but offered referral to gyn surgeon to discuss surgical treatment of metrorrhagia Explained that fibroids usually shrink after menopause. DIscussed following her with ultrasound for growth of the fibroids. Will return in 6 weeks to follow up on the left ovarian cyst.  Dalia Heading, CNM

## 2017-02-15 ENCOUNTER — Other Ambulatory Visit: Payer: Self-pay | Admitting: Certified Nurse Midwife

## 2017-02-15 DIAGNOSIS — D259 Leiomyoma of uterus, unspecified: Secondary | ICD-10-CM

## 2017-02-16 ENCOUNTER — Ambulatory Visit: Payer: BC Managed Care – PPO | Admitting: Certified Nurse Midwife

## 2017-02-16 ENCOUNTER — Other Ambulatory Visit: Payer: BC Managed Care – PPO

## 2017-10-09 ENCOUNTER — Other Ambulatory Visit: Payer: Self-pay | Admitting: Physician Assistant

## 2017-10-09 DIAGNOSIS — F329 Major depressive disorder, single episode, unspecified: Secondary | ICD-10-CM

## 2017-10-09 DIAGNOSIS — F32A Depression, unspecified: Secondary | ICD-10-CM

## 2017-11-20 ENCOUNTER — Ambulatory Visit (INDEPENDENT_AMBULATORY_CARE_PROVIDER_SITE_OTHER): Payer: BC Managed Care – PPO | Admitting: Certified Nurse Midwife

## 2017-11-20 ENCOUNTER — Encounter: Payer: Self-pay | Admitting: Certified Nurse Midwife

## 2017-11-20 VITALS — BP 102/66 | HR 77 | Ht 63.0 in | Wt 157.0 lb

## 2017-11-20 DIAGNOSIS — D251 Intramural leiomyoma of uterus: Secondary | ICD-10-CM | POA: Diagnosis not present

## 2017-11-20 DIAGNOSIS — D252 Subserosal leiomyoma of uterus: Secondary | ICD-10-CM

## 2017-11-20 DIAGNOSIS — Z30432 Encounter for removal of intrauterine contraceptive device: Secondary | ICD-10-CM

## 2017-11-20 DIAGNOSIS — N921 Excessive and frequent menstruation with irregular cycle: Secondary | ICD-10-CM | POA: Diagnosis not present

## 2017-11-27 NOTE — Progress Notes (Signed)
Obstetrics & Gynecology Office Visit   Chief Complaint:  Chief Complaint  Patient presents with  . Abnormal uterine bleeding    heavy bleeding x 2 weeks    History of Present Illness: 47 year old G3 P3003 presents with complaints of heavy bleeding that started 2 weeks ago. She occasionally has a "gush" soiling her clothes. Has dime sized clots. She has a Mirena IUD that was inserted 04/02/2013 and has been having lite monthly bleeding lasting 3-4 days but has been seen for prolonged sometimes heavy bleeding since its insertion. She denies abdominal pain or cramping with the bleeding. Denies increased pelvic pressure.  Last year when she presented for her annual exam, she had an enlarged uterus. On ultrasound there were  two fibroids: the largest was posterior and measures 53.99x 38.91 cm and the smaller fibroid was 27.19 x 28.53 cm and anterior. Both seemed to be IM or subserosal.. Endometrium was 7.13 mm thick and there was a simple 3.6 x 4.0 cm cyst on the left ovary. There appeared to be some Nabothian cysts on the cervix. The IUD was in place. There is also a history of endometrial polyps in 2008 and hysteroscopy and D&C.   Review of Systems:  Review of Systems  Constitutional: Positive for malaise/fatigue. Negative for chills, fever and weight loss.  HENT: Negative for congestion, sinus pain and sore throat.   Eyes: Negative for blurred vision and pain.  Respiratory: Negative for hemoptysis, shortness of breath and wheezing.   Cardiovascular: Negative for chest pain, palpitations and leg swelling.  Gastrointestinal: Negative for abdominal pain, blood in stool, diarrhea, heartburn, nausea and vomiting.  Genitourinary: Negative for dysuria, frequency, hematuria and urgency.       Positive for menorrhagia  Musculoskeletal: Negative for back pain, joint pain and myalgias.  Skin: Negative for itching and rash.  Neurological: Negative for dizziness, tingling and headaches.    Endo/Heme/Allergies: Negative for environmental allergies and polydipsia. Does not bruise/bleed easily.       Negative for hirsutism   Psychiatric/Behavioral: Negative for depression. The patient is not nervous/anxious and does not have insomnia.      Past Medical History:  Past Medical History:  Diagnosis Date  . Anxiety   . Dizziness     Past Surgical History:  Past Surgical History:  Procedure Laterality Date  . HYSTEROSCOPY W/D&C  2008   endometrial polyp  . MM BREAST STEREO BIOPSY LEFT ([place] HX)  04/26/2013  . ORIF ANKLE FRACTURE Right 1989  . WISDOM TOOTH EXTRACTION      Gynecologic History: No LMP recorded. Patient is not currently having periods (Reason: IUD).  Obstetric History: [id number]  Family History:  Family History  Problem Relation Age of Onset  . Congestive Heart Failure Mother   . Depression Mother   . Diabetes Mother   . Bipolar disorder Mother   . Heart disease Mother   . Hypertension Mother   . Psychiatric Illness Father   . Bipolar disorder Sister   . Lung cancer Maternal Aunt   . Lung cancer Maternal Aunt   . Heart disease Maternal Uncle     Social History:  Social History   Socioeconomic History  . Marital status: Married    Spouse name: Not on file  . Number of children: 3  . Years of education: 77  . Highest education level: Not on file  Social Needs  . Financial resource strain: Not on file  . Food insecurity - worry: Not on file  .  Food insecurity - inability: Not on file  . Transportation needs - medical: Not on file  . Transportation needs - non-medical: Not on file  Occupational History  . Occupation: Pharmacist, hospital  Tobacco Use  . Smoking status: Never Smoker  . Smokeless tobacco: Never Used  Substance and Sexual Activity  . Alcohol use: Yes    Alcohol/week: 0.0 oz    Comment: OCCASIONALLY  . Drug use: No  . Sexual activity: Yes    Partners: Male    Birth control/protection: IUD  Other Topics Concern  . Not on file   Social History Narrative  . Not on file    Allergies:  No Known Allergies  Medications: Prior to Admission medications   Medication Sig Start Date End Date Taking? Authorizing Provider  FLUoxetine (PROZAC) 40 MG capsule TAKE 1 CAPSULE(40 MG) BY MOUTH DAILY 10/09/17  Yes Mar Daring, PA-C  levonorgestrel (MIRENA, 52 MG,) 20 MCG/24HR IUD inserted 04/02/13 10/07/09  Yes [provider]    Physical Exam Vitals:BP 102/66   Pulse 77   Ht 5\' 3"  (1.6 m)   Wt 157 lb (71.2 kg)   BMI 27.81 kg/m      Physical Exam  Constitutional: She appears well-developed and well-nourished. No distress.  Cardiovascular: Normal rate.  Respiratory: Effort normal.   Abdomen: soft, non-tender, non-distended.  Umbilicus without lesions.  No hepatomegaly. Uterus palpable about half way between SP and U. No evidence of hernia  Genitourinary:             External: Normal external female genitalia.  Normal urethral meatus, normal Bartholin's and Skene's glands.               Vagina: Normal vaginal mucosa, moderate amount of blood in vault             Cervix: extremely posterior cervix, IUD strings visible at cervical os.              Uterus: Enlarged-12 week size, immobile, NT, AV             Adnexa: unable to evaluate due to enlarged uterus             Rectal: deferred             Lymphatic: no evidence of inguinal lymphadenopathy  Neuro: alert, oriented Skin: warm and dry, no rash Psych: anxious, behavior normal   Assessment: 47 y.o. [id number] with menorrhagia. Her bleeding may be due to the IUD, but can not rule out other pathology like polyps, submucosal fibroids. Risk of hyperplasia is low due to the presence of progesterone IUD. Bleeding can also be caused by anovulatory bleeding (perimenopausal ). Recommend removing IUD (almost at expiration date anyway) and see if bleeding stops and normalizes. Condoms for contraception in the meantime. If heavy bleeding persists, patient to let me  know-will evaluate with ultrasound and endometrial biopsy (+/- ocps or norethindrone) and or send to gynecologist to discuss surgical treatments (ablation, BTL or hysterectomy). If bleeding normalizes, can reinsert IUD if desires.  Plan: Patient agrees with IUD removal and POM. See procedure note below. Dalia Heading, CNM   Pelvic exam:  Two IUD strings present seen coming from the cervical os. EGBUS, vaginal vault and cervix: within normal limits  IUD Removal Strings of IUD identified and grasped.  IUD removed without problem.  Pt tolerated this well.  IUD noted to be intact.  Assessment: IUD Removal  Plan: IUD removed and plan for contraception is condoms. Menstrual diary  RTO in 2 months  Dalia Heading, [place]

## 2017-12-13 ENCOUNTER — Encounter: Payer: Self-pay | Admitting: Certified Nurse Midwife

## 2017-12-13 ENCOUNTER — Ambulatory Visit (INDEPENDENT_AMBULATORY_CARE_PROVIDER_SITE_OTHER): Payer: BC Managed Care – PPO | Admitting: Certified Nurse Midwife

## 2017-12-13 VITALS — BP 104/58 | HR 74 | Ht 63.0 in | Wt 150.0 lb

## 2017-12-13 DIAGNOSIS — N946 Dysmenorrhea, unspecified: Secondary | ICD-10-CM

## 2017-12-13 MED ORDER — NORETHINDRONE ACETATE 5 MG PO TABS: 5.0000 mg | ORAL_TABLET | Freq: Every day | ORAL | 0 refills | Status: DC

## 2017-12-14 ENCOUNTER — Other Ambulatory Visit: Payer: Self-pay | Admitting: Certified Nurse Midwife

## 2017-12-14 ENCOUNTER — Telehealth: Payer: Self-pay | Admitting: Certified Nurse Midwife

## 2017-12-14 LAB — CBC WITH DIFFERENTIAL/PLATELET
Basophils Absolute: 0 10*3/uL (ref 0.0–0.2)
Basos: 0 %
EOS (ABSOLUTE): 0.1 10*3/uL (ref 0.0–0.4)
Eos: 1 %
Hematocrit: 24.8 % — ABNORMAL LOW (ref 34.0–46.6)
Hemoglobin: 8.1 g/dL — ABNORMAL LOW (ref 11.1–15.9)
Immature Grans (Abs): 0 10*3/uL (ref 0.0–0.1)
Immature Granulocytes: 0 %
Lymphocytes Absolute: 2.4 10*3/uL (ref 0.7–3.1)
Lymphs: 38 %
MCH: 30.2 pg (ref 26.6–33.0)
MCHC: 32.7 g/dL (ref 31.5–35.7)
MCV: 93 fL (ref 79–97)
Monocytes Absolute: 0.4 10*3/uL (ref 0.1–0.9)
Monocytes: 7 %
Neutrophils Absolute: 3.5 10*3/uL (ref 1.4–7.0)
Neutrophils: 54 %
Platelets: 265 10*3/uL (ref 150–379)
RBC: 2.68 x10E6/uL — CL (ref 3.77–5.28)
RDW: 13.3 % (ref 12.3–15.4)
WBC: 6.4 10*3/uL (ref 3.4–10.8)

## 2017-12-14 LAB — TSH: TSH: 2.33 u[IU]/mL (ref 0.450–4.500)

## 2017-12-14 MED ORDER — FERROUS FUMARATE-FOLIC ACID 324-1 MG PO TABS: 1.0000 | ORAL_TABLET | Freq: Every day | ORAL | 1 refills | Status: DC

## 2017-12-14 NOTE — Telephone Encounter (Signed)
Called and advised that she is anemic with a hemoglobin =8.1gm/dl. Will call in RX for Stewart, or will need to get ferrous sulfate OTC. Can take with stool softener. Scheduled also for pelvic ultrasound at 3 PM on day she is seeing Dr Kenton Kingfisher next week for follow up.

## 2017-12-18 LAB — PATHOLOGY

## 2017-12-19 ENCOUNTER — Other Ambulatory Visit: Payer: Self-pay | Admitting: Certified Nurse Midwife

## 2017-12-19 DIAGNOSIS — N920 Excessive and frequent menstruation with regular cycle: Secondary | ICD-10-CM

## 2017-12-19 DIAGNOSIS — D649 Anemia, unspecified: Secondary | ICD-10-CM | POA: Insufficient documentation

## 2017-12-19 NOTE — Progress Notes (Signed)
Obstetrics & Gynecology Office Visit   Chief Complaint:  Chief Complaint  Patient presents with  . Menstrual Problem    heavy bleeding since IUD removal    History of Present Illness: 47 year old G3 P3003 initially presented on 11/20/17 with complaints of heavy bleeding with dime sized clots x2 weeks. She had often also had intermittent heavy bleeding "gushes" since her Mirena was replaced in 2014. Her Mirena was removed 11/20/2017 to see if her bleeding would decrease and resolve. She continued to bleed moderately x 2 weeks then the bleeding tapered off, almost stopping in the next week. On 12/10/2017, she resumed having heavy bleeding with clots, requiring her to change pads every 1-2 hours and several times bleeding through her pad and soiling her clothes. At times she has felt lightheaded.   She also has a history of fibroids that were diagnosed last year-and appeared to be intramural and subserosal. She has a history of having a endometrial polypectomy in 2008.    Review of Systems:  ROS -comprehensive review of systems was negative except as stated in the  HPI  Past Medical History:  Past Medical History:  Diagnosis Date  . Anxiety   . Dizziness     Past Surgical History:  Past Surgical History:  Procedure Laterality Date  . HYSTEROSCOPY W/D&C  2008   endometrial polyp  . MM BREAST STEREO BIOPSY LEFT ([place] HX)  04/26/2013  . ORIF ANKLE FRACTURE Right 1989  . WISDOM TOOTH EXTRACTION      Gynecologic History: Patient's last menstrual period was 12/10/2017.  Obstetric History: [id number]  Family History:  Family History  Problem Relation Age of Onset  . Congestive Heart Failure Mother   . Depression Mother   . Diabetes Mother   . Bipolar disorder Mother   . Heart disease Mother   . Hypertension Mother   . Psychiatric Illness Father   . Bipolar disorder Sister   . Lung cancer Maternal Aunt   . Lung cancer Maternal Aunt   . Heart disease Maternal Uncle     Social  History:  Social History   Socioeconomic History  . Marital status: Married    Spouse name: Not on file  . Number of children: 3  . Years of education: 62  . Highest education level: Not on file  Occupational History  . Occupation: Pharmacist, hospital  Social Needs  . Financial resource strain: Not on file  . Food insecurity:    Worry: Not on file    Inability: Not on file  . Transportation needs:    Medical: Not on file    Non-medical: Not on file  Tobacco Use  . Smoking status: Never Smoker  . Smokeless tobacco: Never Used  Substance and Sexual Activity  . Alcohol use: Yes    Alcohol/week: 0.0 oz    Comment: OCCASIONALLY  . Drug use: No  . Sexual activity: Yes    Partners: Male    Birth control/protection: IUD  Lifestyle  . Physical activity:    Days per week: 0 days    Minutes per session: 0 min  . Stress: Only a little  Relationships  . Social connections:    Talks on phone: Not on file    Gets together: Not on file    Attends religious service: Not on file    Active member of club or organization: Not on file    Attends meetings of clubs or organizations: Not on file    Relationship status:  Not on file  . Intimate partner violence:    Fear of current or ex partner: Not on file    Emotionally abused: Not on file    Physically abused: Not on file    Forced sexual activity: Not on file  Other Topics Concern  . Not on file  Social History Narrative  . Not on file    Allergies:  No Known Allergies  Medications: Prior to Admission medications   Medication Sig Start Date End Date Taking? Authorizing Provider  FLUoxetine (PROZAC) 40 MG capsule TAKE 1 CAPSULE(40 MG) BY MOUTH DAILY 10/09/17  Yes Burnette, [name], PA-C  Ferrous Fumarate-Folic Acid 157-2 MG TABS Take 1 tablet by mouth daily. 12/14/17   Dalia Heading, CNM           Physical Exam Vitals: BP (!) 104/58   Pulse 74   Ht 5\' 3"  (1.6 m)   Wt 150 lb (68 kg)   LMP 12/10/2017 Comment: ongoing bleeding  since IUD removal  BMI 26.57 kg/m  Physical Exam  Constitutional: She is oriented to person, place, and time. She appears well-developed and well-nourished. No distress.  Eyes:  Conjunctiva is pale  Cardiovascular: Normal rate.  Respiratory: Effort normal.  Neurological: She is alert and oriented to person, place, and time.  Skin: Skin is warm and dry.  Psychiatric: She has a normal mood and affect.     Assessment: 47 y.o. [id number] with menometrorrhagia  Plan: Recommend endometrial biopsy and patient consented after explaining procedure and possible risks of infection or perforation. See procedure note  CBC, TSH  Start norethindrone 5 mgm daily to try to reduce bleeding while awaiting biopsy results. Will return to office to discuss hysteroscopy and D&C with MD next week.    Endometrial Biopsy After discussion with the patient regarding her abnormal uterine bleeding I recommended that she proceed with an endometrial biopsy for further diagnosis. The risks, benefits, alternatives, and indications for an endometrial biopsy were discussed with the patient in detail. She understood the risks including infection, bleeding, cervical laceration and uterine perforation.  Written consent was obtained.   PROCEDURE NOTE:  Pipelle endometrial biopsy was performed using aseptic technique with iodine preparation.  The uterus was sounded to a length of 8.5 cm.  Adequate sampling was obtained (2 samples were taken with 2 different Pipelles since much of the sample was blood)  The patient tolerated the procedure well.  Sample sent to pathology. Will call patient with results.  Dalia Heading, CNM

## 2017-12-20 ENCOUNTER — Ambulatory Visit (INDEPENDENT_AMBULATORY_CARE_PROVIDER_SITE_OTHER): Payer: BC Managed Care – PPO

## 2017-12-20 ENCOUNTER — Encounter: Payer: Self-pay | Admitting: Obstetrics & Gynecology

## 2017-12-20 ENCOUNTER — Ambulatory Visit: Payer: BC Managed Care – PPO | Admitting: Obstetrics & Gynecology

## 2017-12-20 VITALS — BP 120/60 | HR 65 | Ht 63.0 in | Wt 150.0 lb

## 2017-12-20 DIAGNOSIS — N921 Excessive and frequent menstruation with irregular cycle: Secondary | ICD-10-CM

## 2017-12-20 DIAGNOSIS — N920 Excessive and frequent menstruation with regular cycle: Secondary | ICD-10-CM

## 2017-12-20 DIAGNOSIS — D252 Subserosal leiomyoma of uterus: Secondary | ICD-10-CM | POA: Diagnosis not present

## 2017-12-20 DIAGNOSIS — D251 Intramural leiomyoma of uterus: Secondary | ICD-10-CM

## 2017-12-20 DIAGNOSIS — D500 Iron deficiency anemia secondary to blood loss (chronic): Secondary | ICD-10-CM

## 2017-12-20 NOTE — Progress Notes (Signed)
  HPI: Pt had bleeding in 5th year of Mirena.  Known h/o fibroids.  Removed IUD and then had heavy bleeding episode for tw eeks.  Treated well w norethinedrone.  No pain.  Ultrasound demonstrates 2 fibroids; 3 and 6 cm in size, stable from prior US one year ago. These findings are  c/w h/o fibroids and can be cause for sx's; also consideration for DUB   PMHx: She  has a past medical history of Anxiety and Dizziness. Also,  has a past surgical history that includes MM BREAST STEREO BIOPSY LEFT ([facility] HX) (04/26/2013); Hysteroscopy w/D&C (2008); Wisdom tooth extraction; and ORIF ankle fracture (Right, 1989)., family history includes Bipolar disorder in her mother and sister; Congestive Heart Failure in her mother; Depression in her mother; Diabetes in her mother; Heart disease in her maternal uncle and mother; Hypertension in her mother; Lung cancer in her maternal aunt and maternal aunt; Psychiatric Illness in her father.,  reports that she has never smoked. She has never used smokeless tobacco. She reports that she drinks alcohol. She reports that she does not use drugs.  She has a current medication list which includes the following prescription(s): ferrous fumarate-folic acid, fluoxetine, and norethindrone. Also, has No Known Allergies.  Review of Systems  All other systems reviewed and are negative.  Objective: BP 120/60   Pulse 65   Ht 5\' 3"  (1.6 m)   Wt 150 lb (68 kg)   LMP 12/10/2017 Comment: ongoing bleeding since IUD removal  BMI 26.57 kg/m   Physical examination Constitutional NAD, Conversant  Skin No rashes, lesions or ulceration.   Extremities: Moves all appropriately.  Normal ROM for age. No lymphadenopathy.  Neuro: Grossly intact  Psych: Oriented to PPT.  Normal mood. Normal affect.   IUD PROCEDURE NOTE:  CHERON CORYELL is a 47 y.o. [phone] here for IUD insertion.  Last pap smear was normal.  IUD Insertion Procedure Note Patient identified, informed consent  performed, consent signed.   Discussed risks of irregular bleeding, cramping, infection, malpositioning or misplacement of the IUD outside the uterus which may require further procedure such as laparoscopy, risk of failure <1%. Time out was performed.   A bimanual exam showed the uterus to be midposition.  Speculum placed in the vagina.  Cervix visualized.  Cleaned with Betadine x 2.  Grasped anteriorly with a single tooth tenaculum.  Uterus sounded to 8 cm.   IUD placed per manufacturer's recommendations.  Strings trimmed to 3 cm. Tenaculum was removed, good hemostasis noted.  Patient tolerated procedure well.   Patient was given post-procedure instructions.  She was advised to have backup contraception for one week.  Patient was also asked to check IUD strings periodically and follow up in 4 weeks for IUD check. (at Annual Appt)  Assessment:  Intramural and subserous leiomyoma of uterus Menometrorrhagia Iron deficiency anemia due to chronic blood loss  Fibroid treatment such as [place], Lupron, Myomectomy, and Hysterectomy discussed in detail, with the pros and cons of each choice counseled.  No treatment as an option also discussed, as well as control of symptoms alone with hormone therapy. Information provided to the patient. Hormonal therapy for now w new IUD as last one was at end of lifespan. Consider Lap Hyst as next option.  Check CBC nv to see if anemia resolves itself.   Barnett Applebaum, MD, [facility], [facility] 12/20/2017  4:14 PM

## 2017-12-20 NOTE — Patient Instructions (Signed)
Total Laparoscopic Hysterectomy °A total laparoscopic hysterectomy is a minimally invasive surgery to remove your uterus and cervix. This surgery is performed by making several small cuts (incisions) in your abdomen. It can also be done with a thin, lighted tube (laparoscope) inserted into two small incisions in your lower abdomen. Your fallopian tubes and ovaries can be removed (bilateral salpingo-oophorectomy) during this surgery as well. Benefits of minimally invasive surgery include: °· Less pain. °· Less risk of blood loss. °· Less risk of infection. °· Quicker return to normal activities. ° °Tell a health care provider about: °· Any allergies you have. °· All medicines you are taking, including vitamins, herbs, eye drops, creams, and over-the-counter medicines. °· Any problems you or family members have had with anesthetic medicines. °· Any blood disorders you have. °· Any surgeries you have had. °· Any medical conditions you have. °What are the risks? °Generally, this is a safe procedure. However, as with any procedure, complications can occur. Possible complications include: °· Bleeding. °· Blood clots in the legs or lung. °· Infection. °· Injury to surrounding organs. °· Problems with anesthesia. °· Early menopause symptoms (hot flashes, night sweats, insomnia). °· Risk of conversion to an open abdominal incision. ° °What happens before the procedure? °· Ask your health care provider about changing or stopping your regular medicines. °· Do not take aspirin or blood thinners (anticoagulants) for 1 week before the surgery or as told by your health care provider. °· Do not eat or drink anything for 8 hours before the surgery or as told by your health care provider. °· Quit smoking if you smoke. °· Arrange for a ride home after surgery and for someone to help you at home during recovery. °What happens during the procedure? °· You will be given antibiotic medicine. °· An IV tube will be placed in your arm. You  will be given medicine to make you sleep (general anesthetic). °· A gas (carbon dioxide) will be used to inflate your abdomen. This will allow your surgeon to look inside your abdomen, perform your surgery, and treat any other problems found if necessary. °· Three or four small incisions (often less than 1/2 inch) will be made in your abdomen. One of these incisions will be made in the area of your belly button (navel). The laparoscope will be inserted into the incision. Your surgeon will look through the laparoscope while doing your procedure. °· Other surgical instruments will be inserted through the other incisions. °· Your uterus may be removed through your vagina or cut into small pieces and removed through the small incisions. °· Your incisions will be closed. °What happens after the procedure? °· The gas will be released from inside your abdomen. °· You will be taken to the recovery area where a nurse will watch and check your progress. Once you are awake, stable, and taking fluids well, without other problems, you will return to your room or be allowed to go home. °· There is usually minimal discomfort following the surgery because the incisions are so small. °· You will be given pain medicine while you are in the hospital and for when you go home. °This information is not intended to replace advice given to you by your health care provider. Make sure you discuss any questions you have with your health care provider. °Document Released: 06/26/2007 Document Revised: 02/04/2016 Document Reviewed: 03/19/2013 °Elsevier Interactive Patient Education © 2017 [employer] ° °

## 2018-01-05 ENCOUNTER — Ambulatory Visit: Payer: BC Managed Care – PPO | Admitting: Physician Assistant

## 2018-01-05 ENCOUNTER — Encounter: Payer: Self-pay | Admitting: Physician Assistant

## 2018-01-05 VITALS — BP 106/62 | HR 72 | Temp 98.8°F | Resp 16 | Wt 148.0 lb

## 2018-01-05 DIAGNOSIS — R7309 Other abnormal glucose: Secondary | ICD-10-CM

## 2018-01-05 DIAGNOSIS — F329 Major depressive disorder, single episode, unspecified: Secondary | ICD-10-CM | POA: Diagnosis not present

## 2018-01-05 DIAGNOSIS — Z136 Encounter for screening for cardiovascular disorders: Secondary | ICD-10-CM | POA: Diagnosis not present

## 2018-01-05 DIAGNOSIS — F32A Depression, unspecified: Secondary | ICD-10-CM

## 2018-01-05 DIAGNOSIS — Z13220 Encounter for screening for lipoid disorders: Secondary | ICD-10-CM | POA: Diagnosis not present

## 2018-01-05 MED ORDER — FLUOXETINE HCL 40 MG PO CAPS: 40.0000 mg | ORAL_CAPSULE | Freq: Every day | ORAL | 3 refills | Status: DC

## 2018-01-05 NOTE — Progress Notes (Signed)
       Patient: Allison Beard Female    DOB: [birth year]   47 y.o.   MRN: [id number] Visit Date: 01/05/2018  Today's Provider: Mar Daring, PA-C   Chief Complaint  Patient presents with  . Anxiety   Subjective:    HPI  Patient comes in today for a follow up on anxiety. She currently takes Prozac 40mg  daily. She reports good compliance, and good symptom control. She feels well today with no other symptoms.  She has been doing weight watchers and watching what she eats and has lost approx 30 pounds since she was last seen.    No Known Allergies   Current Outpatient Medications:  .  Ferrous Fumarate-Folic Acid 782-9 MG TABS, Take 1 tablet by mouth daily., Disp: 30 each, Rfl: 1 .  FLUoxetine (PROZAC) 40 MG capsule, TAKE 1 CAPSULE(40 MG) BY MOUTH DAILY, Disp: 90 capsule, Rfl: 0 .  norethindrone (AYGESTIN) 5 MG tablet, Take 1 tablet (5 mg total) by mouth daily. (Patient not taking: Reported on 01/05/2018), Disp: 30 tablet, Rfl: 0  Review of Systems  Constitutional: Negative for activity change, appetite change, chills, diaphoresis, fatigue, fever and unexpected weight change.  Respiratory: Negative for cough and shortness of breath.   Cardiovascular: Negative for chest pain, palpitations and leg swelling.  Psychiatric/Behavioral: Negative for agitation, behavioral problems, confusion, decreased concentration, self-injury, sleep disturbance and suicidal ideas. The patient is not nervous/anxious and is not hyperactive.     Social History   Tobacco Use  . Smoking status: Never Smoker  . Smokeless tobacco: Never Used  Substance Use Topics  . Alcohol use: Yes    Alcohol/week: 0.0 oz    Comment: OCCASIONALLY   Objective:   BP 106/62 (BP Location: Right Arm, Patient Position: Sitting, Cuff Size: Normal)   Pulse 72   Temp 98.8 F (37.1 C)   Resp 16   Wt 148 lb (67.1 kg)   LMP 12/10/2017 Comment: ongoing bleeding since IUD removal  SpO2 97%   BMI 26.22 kg/m  Vitals:   01/05/18 1447  BP: 106/62  Pulse: 72  Resp: 16  Temp: 98.8 F (37.1 C)  SpO2: 97%  Weight: 148 lb (67.1 kg)     Physical Exam  Constitutional: She appears well-developed and well-nourished. No distress.  Neck: Normal range of motion. Neck supple.  Cardiovascular: Normal rate, regular rhythm and normal heart sounds. Exam reveals no gallop and no friction rub.  No murmur heard. Pulmonary/Chest: Effort normal and breath sounds normal. No respiratory distress. She has no wheezes. She has no rales.  Musculoskeletal: She exhibits no edema.  Skin: She is not diaphoretic.  Vitals reviewed.      Assessment & Plan:     1. Depression, unspecified depression type Stable. Diagnosis pulled for medication refill. Continue current medical treatment plan. - FLUoxetine (PROZAC) 40 MG capsule; Take 1 capsule (40 mg total) by mouth daily.  Dispense: 90 capsule; Refill: 3  2. Elevated hemoglobin A1c H/O this. Has lost 30 pounds with healthy lifestyle. Will check labs as below and f/u pending results. - Lipid Profile - HgB A1c  3. Encounter for lipid screening for cardiovascular disease H/O this. Has lost 30 pounds with healthy lifestyle. Will check labs as below and f/u pending results. - Lipid Profile - HgB A1c       Mar Daring, PA-C  [facility]

## 2018-01-31 ENCOUNTER — Ambulatory Visit: Payer: BC Managed Care – PPO | Admitting: Certified Nurse Midwife

## 2018-02-15 ENCOUNTER — Encounter: Payer: Self-pay | Admitting: Certified Nurse Midwife

## 2018-02-15 ENCOUNTER — Ambulatory Visit (INDEPENDENT_AMBULATORY_CARE_PROVIDER_SITE_OTHER): Payer: BC Managed Care – PPO | Admitting: Certified Nurse Midwife

## 2018-02-15 VITALS — BP 110/60 | HR 68 | Ht 63.0 in | Wt 145.0 lb

## 2018-02-15 DIAGNOSIS — Z1231 Encounter for screening mammogram for malignant neoplasm of breast: Secondary | ICD-10-CM | POA: Diagnosis not present

## 2018-02-15 DIAGNOSIS — Z1239 Encounter for other screening for malignant neoplasm of breast: Secondary | ICD-10-CM

## 2018-02-15 DIAGNOSIS — N939 Abnormal uterine and vaginal bleeding, unspecified: Secondary | ICD-10-CM | POA: Diagnosis not present

## 2018-02-15 DIAGNOSIS — D649 Anemia, unspecified: Secondary | ICD-10-CM

## 2018-02-15 DIAGNOSIS — Z124 Encounter for screening for malignant neoplasm of cervix: Secondary | ICD-10-CM

## 2018-02-15 DIAGNOSIS — Z01419 Encounter for gynecological examination (general) (routine) without abnormal findings: Secondary | ICD-10-CM

## 2018-02-15 NOTE — Progress Notes (Signed)
Gynecology Annual Exam  PCP: Mar Daring, PA-C  Chief Complaint:  Chief Complaint  Patient presents with  . Gynecologic Exam    History of Present Illness: Allison Beard is a 47 y.o. G65P3003 White female who presents for her annual exam. The patient has been having problems with metromenorrhagia for years, but the bleeding became significantly heavier the end of February 2019.  She had two fibroids measuring 3cm and 6 cm noted on last year's exam/ultrasound and they seemed to be stable with a thin endometrium (2.19 mm) on her 12/20/17 ultrasound.  An endometrial biopsy was benign but consisted of mostly endometrial fragments. Her bleeding responded to oral norethindrone and she met with Dr Kenton Kingfisher to discuss her options. She decided to have a Mirena IUD inserted. Since her Mirena was inserted 4/10 she has bled daily except for 1-2 weeks. The bleeding has been light to medium, usually requiring a little more than a panty liner. She denies dysmenorrhea. She was also anemic with the heavy bleeding and her hemoglobin 12/13/2017 was 8.1gm/dl. She has been taking ferrous sulfate supplements and is feeling better-not as tired or lightheaded.   Past medical history includes having a D&C and hysteroscopy for an endometrial polyp in 2008. Her PCP at [facility] is treating her for anxiety with Prozac. She had a left breast biopsy for a Birads 4 mammogram 04/24/2013 (calcifications) at [facility] which was benign. There is no family history of breast cancer. She does not do self breast exams. Last Pap smear was 12/07/2016  and was NIL/neg. She has no history of abnormal Pap smears.  She does not use tobacco products. She drinks alcohol occasionally. She does exercise occasionally. She has lost 27# since her last annual on Weight Watchers. She does not get adequate calcium in her [employer] diet.  She had a cholesterol screen 07/2015 and was borderline  Patient is a Allison Beard presents for annual exam. The patient also  complains of intermittent bleeding that lasts 2-3 weeks for the past few months. She had her Mirena replaced in July 2014 after which she would have monthly spotting for a day or two, but in the last few months the bleeding is heavier and intermittent for weeks out of a month. She denies dysmenorrhea, lower abdominal pain/pressure. She also complains of episodes of dizziness which began the end of January.  LMP: No LMP recorded. (Menstrual status: IUD).   Past medical history includes having a D&C and hysteroscopy for an endometrial polyp in 2008. Her PCP at [facility]  is treating her for anxiety with Prozac.Since her last annual 04/02/2013, she had a left breast biopsy for a Birads 4 mammogram 04/24/2013 (calcifications) at [facility] which was benign. There is no family history of breast cancer. She does not do self breast exams. Last Pap smear was 04/02/2013 and was NIL/neg. She has no history of abnormal Pap smears.  She does not use tobacco products. She drinks alcohol occasionally. She does exercise occasionally. She does get adequate calcium in her diet. She had a cholesterol screen 07/2015 and were borderline   Review of Systems: Review of Systems  Constitutional: Positive for weight loss (intentional 27 #). Negative for chills and fever.  HENT: Negative for congestion, sinus pain and sore throat.   Eyes: Negative for blurred vision and pain.  Respiratory: Negative for hemoptysis, shortness of breath and wheezing.   Cardiovascular: Negative for chest pain, palpitations and leg swelling.  Gastrointestinal: Negative for abdominal pain, blood in stool, diarrhea,  heartburn, nausea and vomiting.  Genitourinary: Negative for dysuria, frequency, hematuria and urgency.       Positive for metrorrhagia, negative for dysmenorrhea Sometimes difficulty holding urine  Musculoskeletal: Negative for back pain, joint pain and myalgias.  Skin: Negative for itching and rash.  Neurological: Positive for dizziness.  Negative for tingling and headaches.  Endo/Heme/Allergies: Negative for environmental allergies and polydipsia. Does not bruise/bleed easily.       Negative for hirsutism   Psychiatric/Behavioral: Negative for depression. The patient is nervous/anxious. The patient does not have insomnia.     Past Medical History:  Past Medical History:  Diagnosis Date  . Anemia   . Anxiety   . Depression   . Dizziness   . Menorrhagia   . Uterine fibroid      Past Surgical History:  Past Surgical History:  Procedure Laterality Date  . HYSTEROSCOPY W/D&C  2008   endometrial polyp  . MM BREAST STEREO BIOPSY LEFT ([facility])  04/26/2013  . ORIF ANKLE FRACTURE Right 1989  . WISDOM TOOTH EXTRACTION     Obstetric History: [id number]  Family History:  Family History  Problem Relation Age of Onset  . Depression Mother   . Diabetes Mother   . Bipolar disorder Mother   . Heart disease Mother   . Hypertension Mother   . COPD Mother   . Psychiatric Illness Father   . Bipolar disorder Sister   . Lung cancer Maternal Aunt   . Lung cancer Maternal Aunt   . Heart disease Maternal Uncle     Social History:  Social History   Socioeconomic History  . Marital status: Married    Spouse name: Not on file  . Number of children: 3  . Years of education: 53  . Highest education level: Not on file  Occupational History  . Occupation: Pharmacist, hospital  Social Needs  . Financial resource strain: Not on file  . Food insecurity:    Worry: Not on file    Inability: Not on file  . Transportation needs:    Medical: Not on file    Non-medical: Not on file  Tobacco Use  . Smoking status: Never Smoker  . Smokeless tobacco: Never Used  Substance and Sexual Activity  . Alcohol use: Yes    Alcohol/week: 0.0 oz    Comment: OCCASIONALLY  . Drug use: No  . Sexual activity: Yes    Partners: Male    Birth control/protection: IUD  Lifestyle  . Physical activity:    Days per week: 0 days    Minutes per session: 0  min  . Stress: Only a little  Relationships  . Social connections:    Talks on phone: Not on file    Gets together: Not on file    Attends religious service: Not on file    Active member of club or organization: Not on file    Attends meetings of clubs or organizations: Not on file    Relationship status: Not on file  . Intimate partner violence:    Fear of current or ex partner: Not on file    Emotionally abused: Not on file    Physically abused: Not on file    Forced sexual activity: Not on file  Other Topics Concern  . Not on file  Social History Narrative  . Not on file    Allergies:  No Known Allergies  Medications:  Current Outpatient Medications on File Prior to Visit  Medication Sig Dispense Refill  .  Ferrous Fumarate-Folic Acid 360-6 MG TABS Take 1 tablet by mouth daily. 30 each 1  . FLUoxetine (PROZAC) 40 MG capsule Take 1 capsule (40 mg total) by mouth daily. 90 capsule 3  . levonorgestrel (MIRENA, 52 MG,) 20 MCG/24HR IUD 1 each by Intrauterine route once.     No current facility-administered medications on file prior to visit.    Physical Exam Vitals: BP 110/60   Pulse 68   Ht 5' 3"  (1.6 m)   Wt 145 lb (65.8 kg)   BMI 25.69 kg/m  General: WF in NAD HEENT: normocephalic, anicteric Thyroid: no enlargement, no palpable nodules Pulmonary: No increased work of breathing, CTAB Cardiovascular: RRR without murmur Breast: Breast symmetrical, no tenderness, no palpable nodules or masses, no skin or nipple retraction present, no nipple discharge.  No axillary, infraclavicular, or supraclavicular lymphadenopathy. Abdomen: soft, non-tender, non-distended.  Umbilicus without lesions.  No hepatomegaly. Uterus palpable about half way between SP and U. No evidence of hernia  Genitourinary:  External: Normal external female genitalia.  Normal urethral meatus, normal Bartholin's and Skene's glands.    Vagina: Normal vaginal mucosa, no evidence of prolapse, bloody mucous  discharge   Cervix: posterior and to the left, IUD strings visible   Uterus: deferred due to recent multiple exams  Adnexa: deferred  Rectal: deferred  Lymphatic: no evidence of inguinal lymphadenopathy Extremities: no edema, erythema, or tenderness Neurologic: Grossly intact Psychiatric: mood appropriate, affect full      Assessment: 47 y.o. 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 Continue iron  4) Plan is to see if bleeding eventually stops with the Mirena or menopause or becomes more regular. Patient to return if heavy bleeding resumes or if current bleeding persists and she desires further intervention.  5) Discussed calcium and vitamin D3 requirements and given handout on same.  6) Lipid panel and hemoglobin A1C ordered thru PCP.   Dalia Heading, CNM   Dalia Heading, [place]

## 2018-02-16 LAB — CBC WITH DIFFERENTIAL/PLATELET
Basophils Absolute: 0 10*3/uL (ref 0.0–0.2)
Basos: 0 %
EOS (ABSOLUTE): 0.1 10*3/uL (ref 0.0–0.4)
Eos: 1 %
Hematocrit: 37.2 % (ref 34.0–46.6)
Hemoglobin: 11.9 g/dL (ref 11.1–15.9)
Immature Grans (Abs): 0 10*3/uL (ref 0.0–0.1)
Immature Granulocytes: 0 %
Lymphocytes Absolute: 2.2 10*3/uL (ref 0.7–3.1)
Lymphs: 35 %
MCH: 30.3 pg (ref 26.6–33.0)
MCHC: 32 g/dL (ref 31.5–35.7)
MCV: 95 fL (ref 79–97)
Monocytes Absolute: 0.4 10*3/uL (ref 0.1–0.9)
Monocytes: 6 %
Neutrophils Absolute: 3.7 10*3/uL (ref 1.4–7.0)
Neutrophils: 58 %
Platelets: 248 10*3/uL (ref 150–450)
RBC: 3.93 x10E6/uL (ref 3.77–5.28)
RDW: 12.4 % (ref 12.3–15.4)
WBC: 6.4 10*3/uL (ref 3.4–10.8)

## 2018-02-17 LAB — IGP,RFX APTIMA HPV ALL PTH: PAP Smear Comment: 0

## 2018-03-23 ENCOUNTER — Other Ambulatory Visit: Payer: Self-pay | Admitting: Certified Nurse Midwife

## 2018-03-23 ENCOUNTER — Telehealth: Payer: Self-pay

## 2018-03-23 DIAGNOSIS — Z9889 Other specified postprocedural states: Secondary | ICD-10-CM

## 2018-03-23 DIAGNOSIS — R921 Mammographic calcification found on diagnostic imaging of breast: Secondary | ICD-10-CM

## 2018-03-23 DIAGNOSIS — Z1239 Encounter for other screening for malignant neoplasm of breast: Secondary | ICD-10-CM

## 2018-03-23 DIAGNOSIS — Z124 Encounter for screening for malignant neoplasm of cervix: Secondary | ICD-10-CM

## 2018-03-23 NOTE — Telephone Encounter (Signed)
Allison Beard, I ordered a screening mammogram originally, but [facility] is requiring a diagnostic, since her last mammogram in 2014 was a Birads 4 for calcifications. She did have the mammogram and biopsy at [facility]. SO they are requiring a diagnostic mammogram. Order is in, if you would please schedule

## 2018-03-23 NOTE — Telephone Encounter (Signed)
Per Melissa @ [facility], she will request prior imaging from [facility], and will contact the patient directly to schedule an appointment when imaging has been received. Patient is aware.

## 2018-03-23 NOTE — Telephone Encounter (Signed)
Patient states she went to get her mammogram and there was not an order. She would like for [facility] to put in a diagnostic order. CB# [phone]

## 2018-04-05 ENCOUNTER — Encounter (INDEPENDENT_AMBULATORY_CARE_PROVIDER_SITE_OTHER): Payer: Self-pay

## 2018-04-05 ENCOUNTER — Ambulatory Visit
Admission: RE | Admit: 2018-04-05 | Discharge: 2018-04-05 | Disposition: A | Discharge status: Home / Self Care | Payer: BC Managed Care – PPO | Source: Ambulatory Visit | Attending: Certified Nurse Midwife | Admitting: Certified Nurse Midwife

## 2018-04-05 DIAGNOSIS — Z1239 Encounter for other screening for malignant neoplasm of breast: Secondary | ICD-10-CM

## 2018-04-05 DIAGNOSIS — Z1231 Encounter for screening mammogram for malignant neoplasm of breast: Secondary | ICD-10-CM | POA: Insufficient documentation

## 2018-04-10 ENCOUNTER — Encounter (INDEPENDENT_AMBULATORY_CARE_PROVIDER_SITE_OTHER): Payer: Self-pay

## 2018-07-09 ENCOUNTER — Other Ambulatory Visit: Payer: Self-pay | Admitting: Certified Nurse Midwife

## 2018-12-13 ENCOUNTER — Other Ambulatory Visit: Payer: Self-pay

## 2018-12-13 DIAGNOSIS — F32A Depression, unspecified: Secondary | ICD-10-CM

## 2018-12-13 DIAGNOSIS — F329 Major depressive disorder, single episode, unspecified: Secondary | ICD-10-CM

## 2018-12-13 MED ORDER — FLUOXETINE HCL 40 MG PO CAPS: 40.0000 mg | ORAL_CAPSULE | Freq: Every day | ORAL | 0 refills | Status: DC

## 2018-12-13 NOTE — Telephone Encounter (Signed)
Patient had called office requesting refill on Prozac, I informed patient that she is due for her 6 month follow up, have scheduled a evisit for patient to follow up with [facility] on 01/04/19. Patient uses [facility] in [place]

## 2018-12-13 NOTE — Telephone Encounter (Signed)
Please Review

## 2019-01-04 ENCOUNTER — Ambulatory Visit (INDEPENDENT_AMBULATORY_CARE_PROVIDER_SITE_OTHER): Payer: BC Managed Care – PPO | Admitting: Physician Assistant

## 2019-01-04 ENCOUNTER — Encounter: Payer: Self-pay | Admitting: Physician Assistant

## 2019-01-04 DIAGNOSIS — F32A Depression, unspecified: Secondary | ICD-10-CM | POA: Insufficient documentation

## 2019-01-04 DIAGNOSIS — F3342 Major depressive disorder, recurrent, in full remission: Secondary | ICD-10-CM

## 2019-01-04 DIAGNOSIS — Z1211 Encounter for screening for malignant neoplasm of colon: Secondary | ICD-10-CM

## 2019-01-04 DIAGNOSIS — F329 Major depressive disorder, single episode, unspecified: Secondary | ICD-10-CM | POA: Insufficient documentation

## 2019-01-04 DIAGNOSIS — F411 Generalized anxiety disorder: Secondary | ICD-10-CM | POA: Diagnosis not present

## 2019-01-04 MED ORDER — FLUOXETINE HCL 40 MG PO CAPS: 40.0000 mg | ORAL_CAPSULE | Freq: Every day | ORAL | 3 refills | Status: DC

## 2019-01-04 NOTE — Patient Instructions (Signed)
10 Relaxation Techniques That Zap Stress Fast By Jeannette Moninger   Listen  Relax. You deserve it, it's good for you, and it takes less time than you think. You don't need a spa weekend or a retreat. Each of these stress-relieving tips can get you from OMG to om in less than 15 minutes. 1. Meditate  A few minutes of practice per day can help ease anxiety. "Research suggests that daily meditation may alter the brain's neural pathways, making you more resilient to stress," says psychologist Robbie Maller Hartman, PhD, a [place] health and wellness coach. It's simple. Sit up straight with both feet on the floor. Close your eyes. Focus your attention on reciting -- out loud or silently -- a positive mantra such as "I feel at peace" or "I love myself." Place one hand on your belly to sync the mantra with your breaths. Let any distracting thoughts float by like clouds. 2. Breathe Deeply  Take a 5-minute break and focus on your breathing. Sit up straight, eyes closed, with a hand on your belly. Slowly inhale through your nose, feeling the breath start in your abdomen and work its way to the top of your head. Reverse the process as you exhale through your mouth.  "Deep breathing counters the effects of stress by slowing the heart rate and lowering blood pressure," psychologist Judith Tutin, PhD, says. She's a certified life coach in [place], [place] 3. Be Present  Slow down.  "Take 5 minutes and focus on only one behavior with awareness," Tutin says. Notice how the air feels on your face when you're walking and how your feet feel hitting the ground. Enjoy the texture and taste of each bite of food. When you spend time in the moment and focus on your senses, you should feel less tense. 4. Reach Out  Your social network is one of your best tools for handling stress. Talk to others -- preferably face to face, or at least on the phone. Share what's going on. You can get a fresh perspective while keeping your  connection strong. 5. Tune In to Your Body  Mentally scan your body to get a sense of how stress affects it each day. Lie on your back, or sit with your feet on the floor. Start at your toes and work your way up to your scalp, noticing how your body feels.  "Simply be aware of places you feel tight or loose without trying to change anything," Tutin says. For 1 to 2 minutes, imagine each deep breath flowing to that body part. Repeat this process as you move your focus up your body, paying close attention to sensations you feel in each body part. 6. Decompress  Place a warm heat wrap around your neck and shoulders for 10 minutes. Close your eyes and relax your face, neck, upper chest, and back muscles. Remove the wrap, and use a tennis ball or foam roller to massage away tension.  "Place the ball between your back and the wall. Lean into the ball, and hold gentle pressure for up to 15 seconds. Then move the ball to another spot, and apply pressure," says Cathy Benninger, a nurse practitioner and assistant professor at [employer] [facility] in [place]. 7. Laugh Out Loud  A good belly laugh doesn't just lighten the load mentally. It lowers cortisol, your body's stress hormone, and boosts brain chemicals called endorphins, which help your mood. Lighten up by tuning in to your favorite sitcom or video, reading   the comics, or chatting with someone who makes you smile. 8. Crank Up the Tunes  Research shows that listening to soothing music can lower blood pressure, heart rate, and anxiety. "Create a playlist of songs or nature sounds (the ocean, a bubbling brook, birds chirping), and allow your mind to focus on the different melodies, instruments, or singers in the piece," Benninger says. You also can blow off steam by rocking out to more upbeat tunes -- or singing at the top of your lungs! 9. Get Moving  You don't have to run in order to get a runner's high. All forms of exercise,  including yoga and walking, can ease depression and anxiety by helping the brain release feel-good chemicals and by giving your body a chance to practice dealing with stress. You can go for a quick walk around the block, take the stairs up and down a few flights, or do some stretching exercises like head rolls and shoulder shrugs. 10. Be Grateful  Keep a gratitude journal or several (one by your bed, one in your purse, and one at work) to help you remember all the things that are good in your life.  "Being grateful for your blessings cancels out negative thoughts and worries," says Joni Emmerling, a wellness coach in [place], [place].  Use these journals to savor good experiences like a child's smile, a sunshine-filled day, and good health. Don't forget to celebrate accomplishments like mastering a new task at work or a new hobby. When you start feeling stressed, spend a few minutes looking through your notes to remind yourself what really matters.   

## 2019-01-04 NOTE — Progress Notes (Signed)
Virtual Visit via Video Note  I connected with Allison Beard on 01/04/19 at  1:20 PM EDT by a video enabled telemedicine application and verified that I am speaking with the correct person using two identifiers.   I discussed the limitations of evaluation and management by telemedicine and the availability of in person appointments. The patient expressed understanding and agreed to proceed.  Patient location: home Provider location: [facility] Persons involved in the visit: patient, provider, CMA  Mar Daring, PA-C   Patient: Allison Beard Female    DOB: 1970-12-03   48 y.o.   MRN: [id number] Visit Date: 01/04/2019  Today's Provider: Mar Daring, PA-C   Chief Complaint  Patient presents with  . Depression   Subjective:    I, Allison Beard CMA, am acting as a Education administrator for [employer].   HPI  Depression Patient presents today for depression follow-up. Patient was last seen on 01/05/2018 for depression. Patient is currently taking Prozac 40 mg daily for her depression. States she is stable.  No Known Allergies   Current Outpatient Medications:  .  Ferrous Fumarate-Folic Acid 626-9 MG TABS, Take 1 tablet by mouth daily., Disp: 30 each, Rfl: 1 .  FLUoxetine (PROZAC) 40 MG capsule, Take 1 capsule (40 mg total) by mouth daily., Disp: 90 capsule, Rfl: 0 .  levonorgestrel (MIRENA, 52 MG,) 20 MCG/24HR IUD, 1 each by Intrauterine route once., Disp: , Rfl:   Review of Systems  Constitutional: Negative.   HENT: Negative.   Respiratory: Negative.   Cardiovascular: Negative.   Neurological: Negative.   Psychiatric/Behavioral: Negative.     Social History   Tobacco Use  . Smoking status: Never Smoker  . Smokeless tobacco: Never Used  Substance Use Topics  . Alcohol use: Yes    Alcohol/week: 0.0 standard drinks    Comment: OCCASIONALLY      Objective:   There were no vitals taken for this visit. There were no vitals filed  for this visit.   Physical Exam Vitals signs reviewed.  Constitutional:      General: She is not in acute distress.    Appearance: Normal appearance. She is well-developed. She is not ill-appearing.  HENT:     Head: Normocephalic and atraumatic.  Neck:     Musculoskeletal: Normal range of motion and neck supple.  Pulmonary:     Effort: Pulmonary effort is normal. No respiratory distress.  Neurological:     Mental Status: She is alert.  Psychiatric:        Mood and Affect: Mood normal.        Behavior: Behavior normal.        Thought Content: Thought content normal.        Judgment: Judgment normal.    Depression screen [facility] 2/9 01/04/2019 01/05/2018  Decreased Interest 0 0  Down, Depressed, Hopeless 0 0  PHQ - 2 Score 0 0  Altered sleeping 1 -  Tired, decreased energy 1 -  Change in appetite 1 -  Feeling bad or failure about yourself  0 -  Trouble concentrating 0 -  Moving slowly or fidgety/restless 0 -  Suicidal thoughts 0 -  PHQ-9 Score 3 -  Difficult doing work/chores Not difficult at all -       Assessment & Plan    1. Generalized anxiety disorder Stable. Diagnosis pulled for medication refill. Continue current medical treatment plan. - FLUoxetine (PROZAC) 40 MG capsule; Take 1 capsule (40 mg total) by  mouth daily.  Dispense: 90 capsule; Refill: 3  2. Recurrent major depressive disorder, in full remission ([place]) Stable. Diagnosis pulled for medication refill. Continue current medical treatment plan. - FLUoxetine (PROZAC) 40 MG capsule; Take 1 capsule (40 mg total) by mouth daily.  Dispense: 90 capsule; Refill: 3  3. Colon cancer screening Due for colon cancer screening. No previous colonoscopy. No family history of breast cancer. - Ambulatory referral to Gastroenterology    I discussed the assessment and treatment plan with the patient. The patient was provided an opportunity to ask questions and all were answered. The patient agreed with the plan and  demonstrated an understanding of the instructions.   The patient was advised to call back or seek an in-person evaluation if the symptoms worsen or if the condition fails to improve as anticipated.  I provided 17 minutes of non-face-to-face time during this encounter.   Mar Daring, PA-C  [facility]

## 2019-01-09 ENCOUNTER — Other Ambulatory Visit: Payer: Self-pay

## 2019-01-09 ENCOUNTER — Telehealth: Payer: Self-pay

## 2019-01-09 DIAGNOSIS — Z1211 Encounter for screening for malignant neoplasm of colon: Secondary | ICD-10-CM

## 2019-01-09 NOTE — Telephone Encounter (Signed)
Gastroenterology Pre-Procedure Review  Request Date: 07/31 Requesting Physician: Dr. Bonna Gains  PATIENT REVIEW QUESTIONS: The patient responded to the following health history questions as indicated:    1. Are you having any GI issues? No 2. Do you have a personal history of Polyps? No 3. Do you have a family history of Colon Cancer or Polyps? No 4. Diabetes Mellitus? No 5. Joint replacements in the past 12 months?No 6. Major health problems in the past 3 months? 7. Any artificial heart valves, MVP, or defibrillator?No    MEDICATIONS & ALLERGIES:    Patient reports the following regarding taking any anticoagulation/antiplatelet therapy:   Plavix, Coumadin, Eliquis, Xarelto, Lovenox, Pradaxa, Brilinta, or Effient? No Aspirin? No  Patient confirms/reports the following medications:  Current Outpatient Medications  Medication Sig Dispense Refill  . Ferrous Fumarate-Folic Acid 664-4 MG TABS Take 1 tablet by mouth daily. 30 each 1  . FLUoxetine (PROZAC) 40 MG capsule Take 1 capsule (40 mg total) by mouth daily. 90 capsule 3  . levonorgestrel (MIRENA, 52 MG,) 20 MCG/24HR IUD 1 each by Intrauterine route once.     No current facility-administered medications for this visit.     Patient confirms/reports the following allergies:  No Known Allergies  No orders of the defined types were placed in this encounter.   AUTHORIZATION INFORMATION Primary Insurance: 1D#: Group #:  Secondary Insurance: 1D#: Group #:  SCHEDULE INFORMATION: Date: 04/12/19 Time: Location:[facility]

## 2019-04-09 ENCOUNTER — Encounter
Admission: RE | Admit: 2019-04-09 | Discharge: 2019-04-09 | Disposition: A | Discharge status: Home / Self Care | Payer: BC Managed Care – PPO | Source: Ambulatory Visit | Attending: Gastroenterology | Admitting: Gastroenterology

## 2019-04-11 ENCOUNTER — Telehealth: Payer: Self-pay

## 2019-04-11 NOTE — Telephone Encounter (Signed)
LVM for pt to call office in regards to rescheduling tomorrows procedure because she needs to have [place] test was not a requirement when we scheduled procedure therefore she was not aware.  Thanks [employer]

## 2019-04-12 ENCOUNTER — Ambulatory Visit
Admission: RE | Admit: 2019-04-12 | Payer: BC Managed Care – PPO | Source: Home / Self Care | Admitting: Gastroenterology

## 2019-04-12 ENCOUNTER — Encounter: Admission: RE | Payer: Self-pay | Source: Home / Self Care

## 2019-04-12 SURGERY — COLONOSCOPY WITH PROPOFOL
Anesthesia: General

## 2019-05-06 ENCOUNTER — Other Ambulatory Visit: Payer: Self-pay | Admitting: Certified Nurse Midwife

## 2019-05-06 DIAGNOSIS — Z1231 Encounter for screening mammogram for malignant neoplasm of breast: Secondary | ICD-10-CM

## 2019-06-04 ENCOUNTER — Ambulatory Visit
Admission: RE | Admit: 2019-06-04 | Discharge: 2019-06-04 | Disposition: A | Discharge status: Home / Self Care | Payer: BC Managed Care – PPO | Source: Ambulatory Visit | Attending: Certified Nurse Midwife | Admitting: Certified Nurse Midwife

## 2019-06-04 DIAGNOSIS — Z1231 Encounter for screening mammogram for malignant neoplasm of breast: Secondary | ICD-10-CM | POA: Diagnosis present

## 2019-07-03 IMAGING — MG MM DIGITAL SCREENING BILAT W/ TOMO W/ CAD
8 series · 8 of 24 positions shown · non-contrast
Comparison: Previous exam(s).

CLINICAL DATA: Screening.

EXAM:
DIGITAL SCREENING BILATERAL MAMMOGRAM WITH TOMO AND CAD

[R MLO synth-2D]
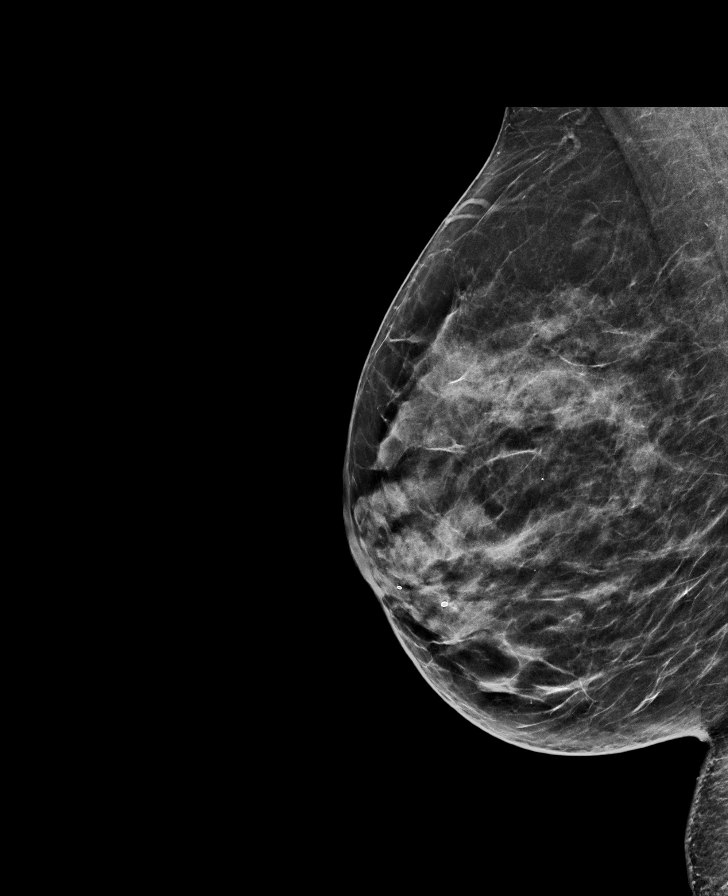

[R CC synth-2D]
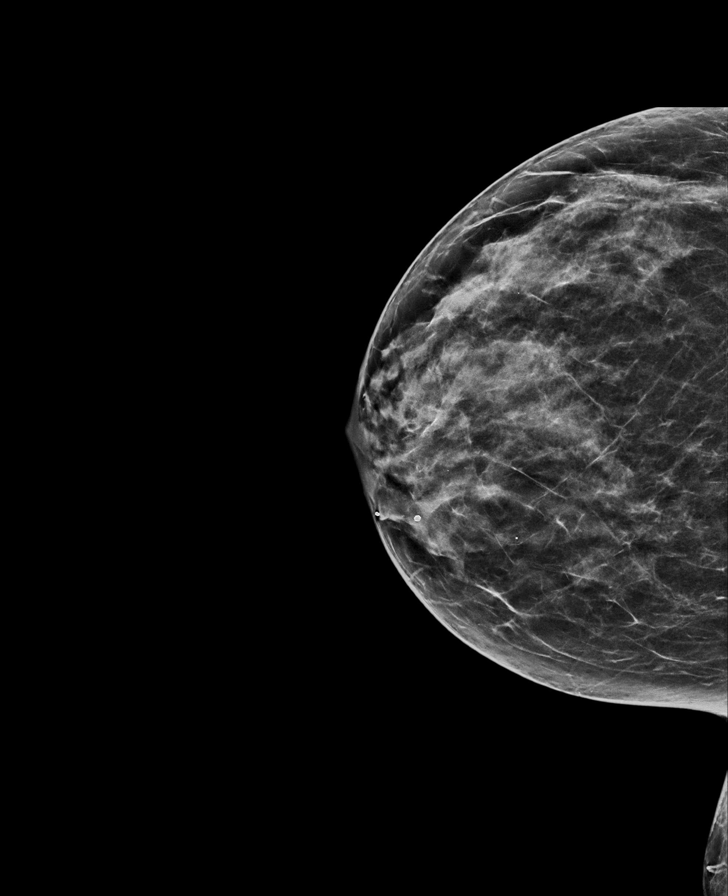

[L MLO synth-2D]
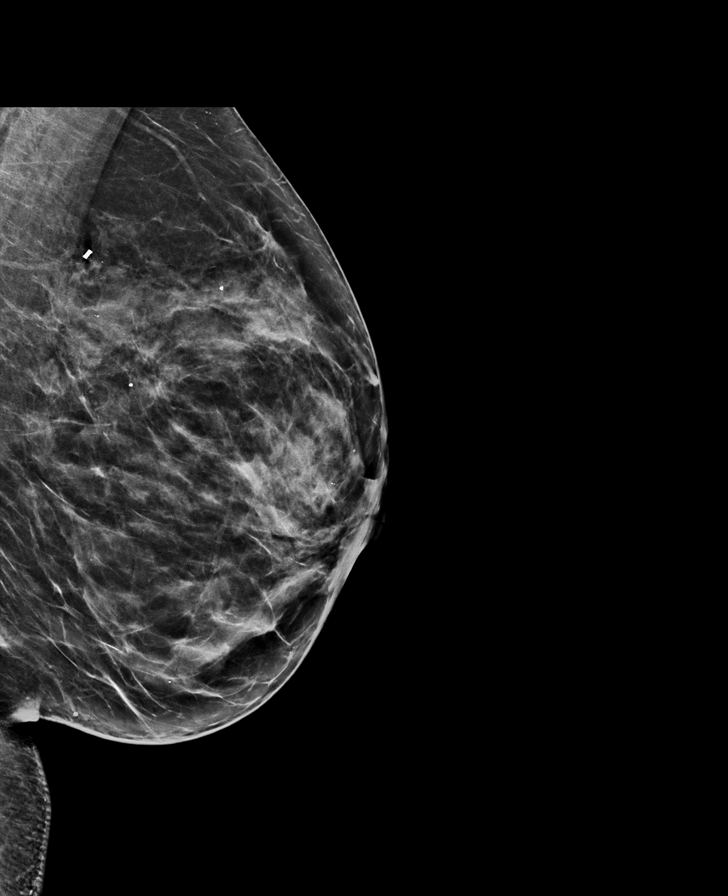

[L CC synth-2D]
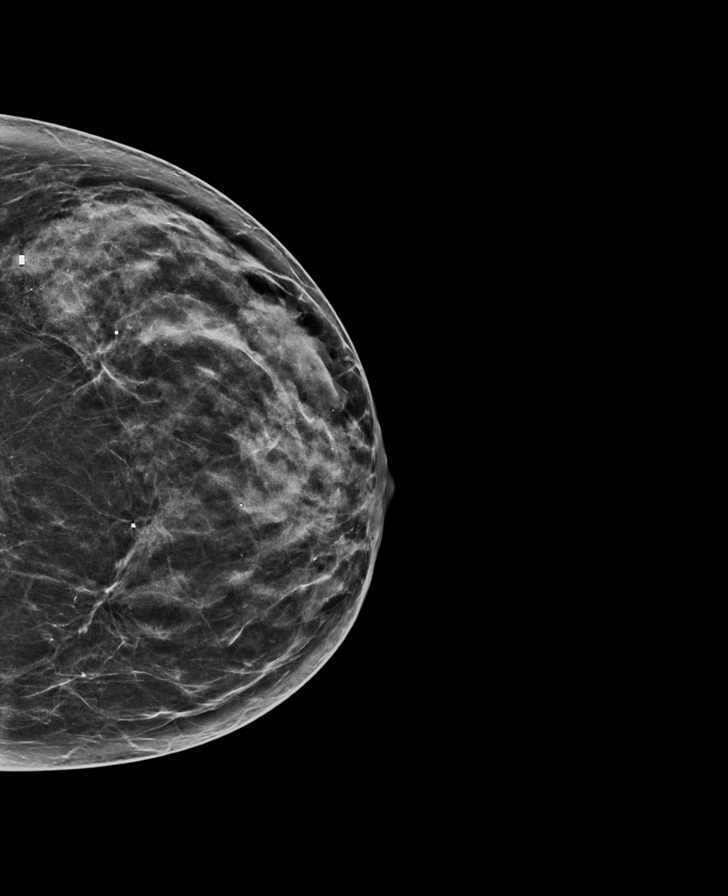

[L CC tomo · tomo slice 32/63.0]
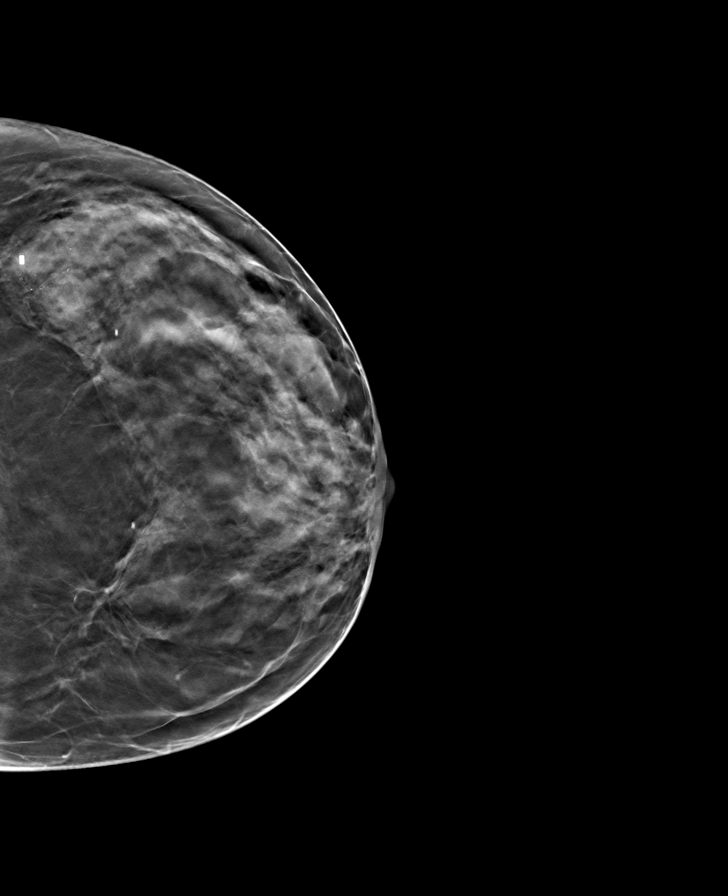

[R CC tomo · tomo slice 33/64.0]
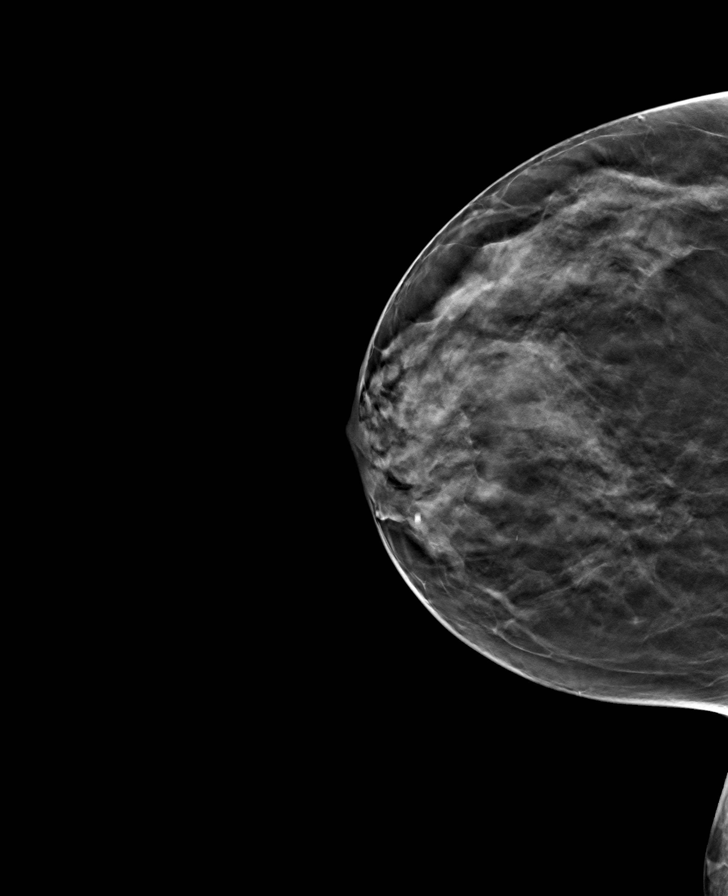

[L MLO tomo · tomo slice 33/66.0]
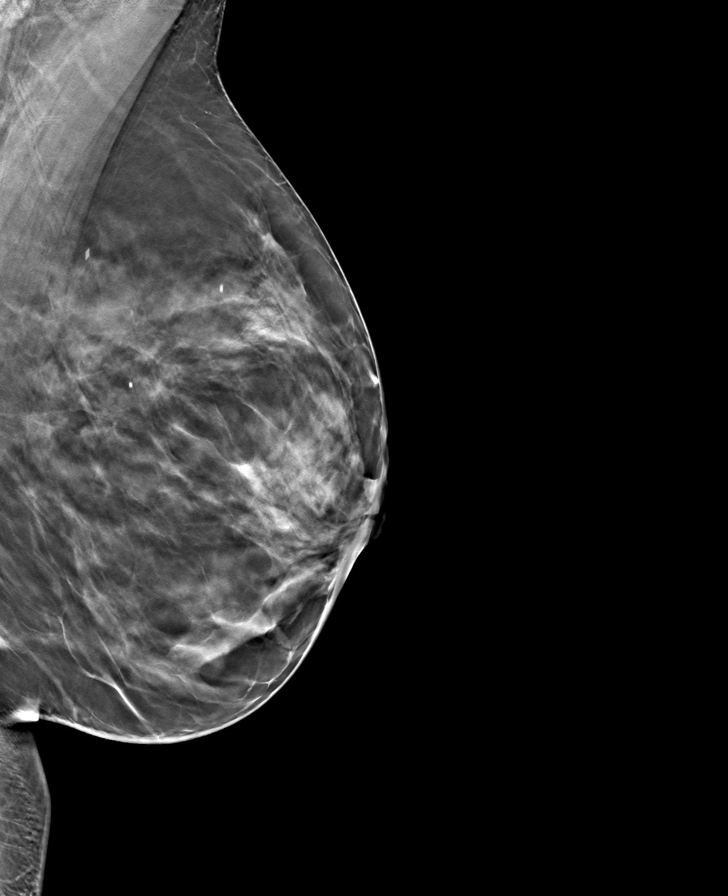

[R MLO tomo · tomo slice 33/64.0]
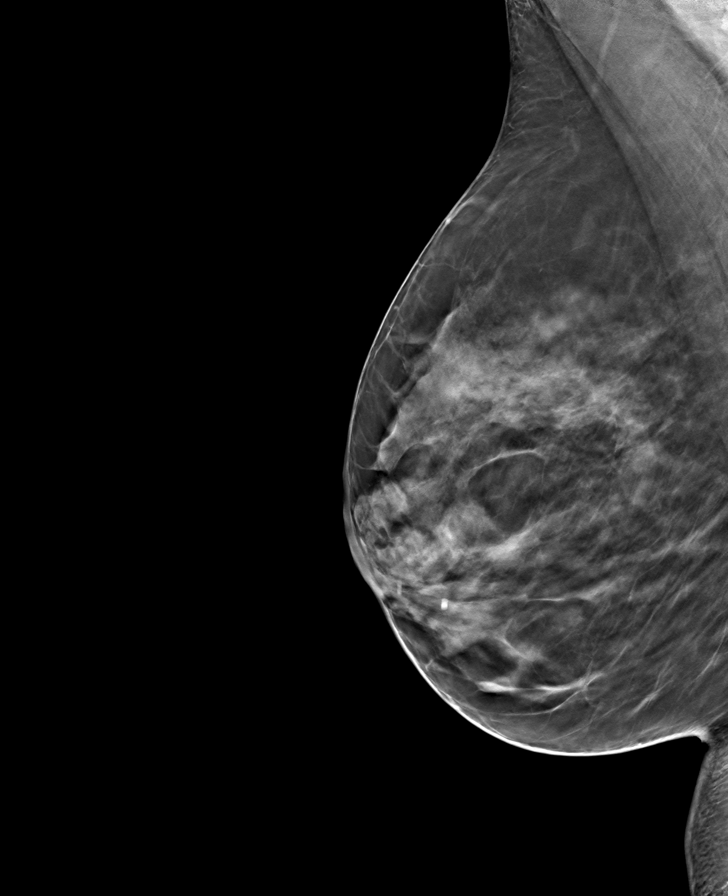

[8 of 24 positions shown; findings below may reference images not displayed]

ACR Breast Density Category c: The breast tissue is heterogeneously
dense, which may obscure small masses.
FINDINGS: There are no findings suspicious for malignancy. Images were
processed with CAD.
IMPRESSION: No mammographic evidence of malignancy. A result letter of this
screening mammogram will be mailed directly to the patient.

RECOMMENDATION:
Screening mammogram in one year. (Code:[id number])

BI-RADS CATEGORY  1: Negative.

## 2020-03-09 ENCOUNTER — Ambulatory Visit (INDEPENDENT_AMBULATORY_CARE_PROVIDER_SITE_OTHER): Payer: BC Managed Care – PPO | Admitting: Physician Assistant

## 2020-03-09 ENCOUNTER — Encounter: Payer: Self-pay | Admitting: Physician Assistant

## 2020-03-09 ENCOUNTER — Other Ambulatory Visit: Payer: Self-pay

## 2020-03-09 ENCOUNTER — Ambulatory Visit: Payer: BC Managed Care – PPO | Admitting: Physician Assistant

## 2020-03-09 VITALS — BP 97/66 | HR 81 | Temp 97.0°F | Resp 16 | Ht 63.0 in | Wt 159.2 lb

## 2020-03-09 DIAGNOSIS — Z6828 Body mass index (BMI) 28.0-28.9, adult: Secondary | ICD-10-CM

## 2020-03-09 DIAGNOSIS — F411 Generalized anxiety disorder: Secondary | ICD-10-CM

## 2020-03-09 DIAGNOSIS — F3342 Major depressive disorder, recurrent, in full remission: Secondary | ICD-10-CM

## 2020-03-09 DIAGNOSIS — Z1211 Encounter for screening for malignant neoplasm of colon: Secondary | ICD-10-CM

## 2020-03-09 MED ORDER — FLUOXETINE HCL 40 MG PO CAPS: 40.0000 mg | ORAL_CAPSULE | Freq: Every day | ORAL | 3 refills | Status: DC

## 2020-03-09 NOTE — Patient Instructions (Signed)
Health Maintenance, Female Adopting a healthy lifestyle and getting preventive care are important in promoting health and wellness. Ask your health care provider about:  The right schedule for you to have regular tests and exams.  Things you can do on your own to prevent diseases and keep yourself healthy. What should I know about diet, weight, and exercise? Eat a healthy diet   Eat a diet that includes plenty of vegetables, fruits, low-fat dairy products, and lean protein.  Do not eat a lot of foods that are high in solid fats, added sugars, or sodium. Maintain a healthy weight Body mass index (BMI) is used to identify weight problems. It estimates body fat based on height and weight. Your health care provider can help determine your BMI and help you achieve or maintain a healthy weight. Get regular exercise Get regular exercise. This is one of the most important things you can do for your health. Most adults should:  Exercise for at least 150 minutes each week. The exercise should increase your heart rate and make you sweat (moderate-intensity exercise).  Do strengthening exercises at least twice a week. This is in addition to the moderate-intensity exercise.  Spend less time sitting. Even light physical activity can be beneficial. Watch cholesterol and blood lipids Have your blood tested for lipids and cholesterol at 51 years of age, then have this test every 5 years. Have your cholesterol levels checked more often if:  Your lipid or cholesterol levels are high.  You are older than 51 years of age.  You are at high risk for heart disease. What should I know about cancer screening? Depending on your health history and family history, you may need to have cancer screening at various ages. This may include screening for:  Breast cancer.  Cervical cancer.  Colorectal cancer.  Skin cancer.  Lung cancer. What should I know about heart disease, diabetes, and high blood  pressure? Blood pressure and heart disease  High blood pressure causes heart disease and increases the risk of stroke. This is more likely to develop in people who have high blood pressure readings, are of African descent, or are overweight.  Have your blood pressure checked: ? Every 3-5 years if you are 18-39 years of age. ? Every year if you are 40 years old or older. Diabetes Have regular diabetes screenings. This checks your fasting blood sugar level. Have the screening done:  Once every three years after age 40 if you are at a normal weight and have a low risk for diabetes.  More often and at a younger age if you are overweight or have a high risk for diabetes. What should I know about preventing infection? Hepatitis B If you have a higher risk for hepatitis B, you should be screened for this virus. Talk with your health care provider to find out if you are at risk for hepatitis B infection. Hepatitis C Testing is recommended for:  Everyone born from 1945 through 1965.  Anyone with known risk factors for hepatitis C. Sexually transmitted infections (STIs)  Get screened for STIs, including gonorrhea and chlamydia, if: ? You are sexually active and are younger than 51 years of age. ? You are older than 51 years of age and your health care provider tells you that you are at risk for this type of infection. ? Your sexual activity has changed since you were last screened, and you are at increased risk for chlamydia or gonorrhea. Ask your health care provider if   you are at risk.  Ask your health care provider about whether you are at high risk for HIV. Your health care provider may recommend a prescription medicine to help prevent HIV infection. If you choose to take medicine to prevent HIV, you should first get tested for HIV. You should then be tested every 3 months for as long as you are taking the medicine. Pregnancy  If you are about to stop having your period (premenopausal) and  you may become pregnant, seek counseling before you get pregnant.  Take 400 to 800 micrograms (mcg) of folic acid every day if you become pregnant.  Ask for birth control (contraception) if you want to prevent pregnancy. Osteoporosis and menopause Osteoporosis is a disease in which the bones lose minerals and strength with aging. This can result in bone fractures. If you are 65 years old or older, or if you are at risk for osteoporosis and fractures, ask your health care provider if you should:  Be screened for bone loss.  Take a calcium or vitamin D supplement to lower your risk of fractures.  Be given hormone replacement therapy (HRT) to treat symptoms of menopause. Follow these instructions at home: Lifestyle  Do not use any products that contain nicotine or tobacco, such as cigarettes, e-cigarettes, and chewing tobacco. If you need help quitting, ask your health care provider.  Do not use street drugs.  Do not share needles.  Ask your health care provider for help if you need support or information about quitting drugs. Alcohol use  Do not drink alcohol if: ? Your health care provider tells you not to drink. ? You are pregnant, may be pregnant, or are planning to become pregnant.  If you drink alcohol: ? Limit how much you use to 0-1 drink a day. ? Limit intake if you are breastfeeding.  Be aware of how much alcohol is in your drink. In the [place] one drink equals one 12 oz bottle of beer (355 mL), one 5 oz glass of wine (148 mL), or one 1 oz glass of hard liquor (44 mL). General instructions  Schedule regular health, dental, and eye exams.  Stay current with your vaccines.  Tell your health care provider if: ? You often feel depressed. ? You have ever been abused or do not feel safe at home. Summary  Adopting a healthy lifestyle and getting preventive care are important in promoting health and wellness.  Follow your health care provider's instructions about healthy  diet, exercising, and getting tested or screened for diseases.  Follow your health care provider's instructions on monitoring your cholesterol and blood pressure. This information is not intended to replace advice given to you by your health care provider. Make sure you discuss any questions you have with your health care provider. Document Revised: 08/22/2018 Document Reviewed: 08/22/2018 Elsevier Patient Education  2020 Elsevier Inc.  

## 2020-03-09 NOTE — Progress Notes (Signed)
Established patient visit   Patient: Allison Beard   DOB: 11-19-1970   49 y.o. Female  MRN: [id number] Visit Date: 03/09/2020  Today's healthcare provider: Mar Daring, PA-C   Chief Complaint  Patient presents with  . Follow-up    Anxiety   Subjective    HPI Patient here for her anxiety medication refill. She reports that she is stable on the fluoxetine.   GAD 7 : Generalized Anxiety Score 03/09/2020  Nervous, Anxious, on Edge 1  Control/stop worrying 0  Worry too much - different things 0  Trouble relaxing 1  Restless 0  Easily annoyed or irritable 1  Afraid - awful might happen 1  Total GAD 7 Score 4  Anxiety Difficulty Not difficult at all    Depression screen [facility] 2/9 03/09/2020  Decreased Interest 1  Down, Depressed, Hopeless 0  PHQ - 2 Score 1  Altered sleeping 1  Tired, decreased energy 2  Change in appetite 0  Feeling bad or failure about yourself  0  Trouble concentrating 0  Moving slowly or fidgety/restless 0  Suicidal thoughts 0  PHQ-9 Score 4  Difficult doing work/chores Not difficult at all   Will follow up with [facility] Clarita Leber) for pap/pelvic exam and breast exams. Mammogram due in sept 2021.  Patient Active Problem List   Diagnosis Date Noted  . Depression 01/04/2019  . Anemia 12/19/2017  . Elevated blood sugar 03/11/2015  . Leiomyoma of uterus 03/11/2015  . Cephalalgia 03/11/2015  . Menometrorrhagia 03/11/2015  . Abnormal mammogram 05/27/2013  . Avitaminosis D 01/21/2010  . Anxiety disorder 10/07/2009  . Cannot sleep 10/07/2009   Past Medical History:  Diagnosis Date  . Anemia   . Anxiety   . Depression   . Dizziness   . Menorrhagia   . Uterine fibroid        Medications: Outpatient Medications Prior to Visit  Medication Sig  . FLUoxetine (PROZAC) 40 MG capsule Take 1 capsule (40 mg total) by mouth daily.  Marland Kitchen levonorgestrel (MIRENA, 52 MG,) 20 MCG/24HR IUD 1 each by Intrauterine route once.  .  Ferrous Fumarate-Folic Acid 597-4 MG TABS Take 1 tablet by mouth daily. (Patient not taking: Reported on 03/09/2020)   No facility-administered medications prior to visit.    Review of Systems  Constitutional: Negative.   Respiratory: Negative.   Cardiovascular: Negative.   Neurological: Negative.   Psychiatric/Behavioral: Negative.     Last CBC Lab Results  Component Value Date   WBC 6.4 02/15/2018   HGB 11.9 02/15/2018   HCT 37.2 02/15/2018   MCV 95 02/15/2018   MCH 30.3 02/15/2018   RDW 12.4 02/15/2018   PLT 248 16/38/4536   Last metabolic panel Lab Results  Component Value Date   GLUCOSE 94 12/15/2016   NA 139 12/15/2016   K 4.8 12/15/2016   CL 100 12/15/2016   CO2 26 12/15/2016   BUN 9 12/15/2016   CREATININE 0.72 12/15/2016   GFRNONAA 99 12/15/2016   GFRAA 115 12/15/2016   CALCIUM 9.9 12/15/2016   PROT 6.9 12/15/2016   ALBUMIN 4.0 12/15/2016   LABGLOB 2.9 12/15/2016   AGRATIO 1.4 12/15/2016   BILITOT <0.2 12/15/2016   ALKPHOS 69 12/15/2016   AST 16 12/15/2016   ALT 11 12/15/2016      Objective    BP 97/66 (BP Location: Left Arm, Patient Position: Sitting, Cuff Size: Normal)   Pulse 81   Temp (!) 97 F (36.1 C) (Temporal)  Resp 16   Ht 5\' 3"  (1.6 m)   Wt 159 lb 3.2 oz (72.2 kg)   BMI 28.20 kg/m  BP Readings from Last 3 Encounters:  03/09/20 97/66  02/15/18 110/60  01/05/18 106/62   Wt Readings from Last 3 Encounters:  03/09/20 159 lb 3.2 oz (72.2 kg)  02/15/18 145 lb (65.8 kg)  01/05/18 148 lb (67.1 kg)      Physical Exam Vitals reviewed.  Constitutional:      General: She is not in acute distress.    Appearance: Normal appearance. She is well-developed, well-groomed and overweight. She is not diaphoretic.  Cardiovascular:     Rate and Rhythm: Normal rate and regular rhythm.     Heart sounds: Normal heart sounds. No murmur heard.  No friction rub. No gallop.   Pulmonary:     Effort: Pulmonary effort is normal. No respiratory  distress.     Breath sounds: Normal breath sounds. No wheezing or rales.  Musculoskeletal:     Cervical back: Normal range of motion and neck supple.  Neurological:     Mental Status: She is alert.  Psychiatric:        Mood and Affect: Mood normal.        Behavior: Behavior normal. Behavior is cooperative.        Thought Content: Thought content normal.        Judgment: Judgment normal.       No results found for any visits on 03/09/20.  Assessment & Plan     1. Colon cancer screening Patient had been scheduled for colonoscopy last year, but was cancelled due to covid-19. Patient interested in trying cologuard instead this year. No history of polyps and no family history of colon cancer.  - Cologuard  2. Generalized anxiety disorder Stable. Diagnosis pulled for medication refill. Continue current medical treatment plan. - FLUoxetine (PROZAC) 40 MG capsule; Take 1 capsule (40 mg total) by mouth daily.  Dispense: 90 capsule; Refill: 3  3. Recurrent major depressive disorder, in full remission ([place]) Stable. Diagnosis pulled for medication refill. Continue current medical treatment plan. - FLUoxetine (PROZAC) 40 MG capsule; Take 1 capsule (40 mg total) by mouth daily.  Dispense: 90 capsule; Refill: 3  4. BMI 28.0-28.9,adult Counseled patient on healthy lifestyle modifications including dieting and exercise.    No follow-ups on file.      Reynolds Bowl, PA-C, have reviewed all documentation for this visit. The documentation on 03/09/20 for the exam, diagnosis, procedures, and orders are all accurate and complete.   Rubye Beach  [facility] [phone] (phone) [phone] (fax)  [place]

## 2020-03-17 NOTE — Progress Notes (Deleted)
Mar Daring, PA-C   No chief complaint on file.   HPI:      Allison Beard is a 49 y.o. [phone] whose LMP was No LMP recorded. (Menstrual status: IUD)., presents today for ***  IUD placed 12/20/17. Hx of leio/menorrhagiaUltrasound demonstrates 2 fibroids; 3 and 6 cm in size, stable from prior US one year ago. Normal CBC 6/19 but hx of anemia due to menorrhagia, did Fe supp  Last pap neg 6/19  Past Medical History:  Diagnosis Date  . Anemia   . Anxiety   . Depression   . Dizziness   . Menorrhagia   . Uterine fibroid     Past Surgical History:  Procedure Laterality Date  . BREAST BIOPSY Left 2015   neg-, neddle bx/clip and bx  . HYSTEROSCOPY WITH D & C  2008   endometrial polyp  . MM BREAST STEREO BIOPSY LEFT ([place] HX)  04/26/2013  . ORIF ANKLE FRACTURE Right 1989  . WISDOM TOOTH EXTRACTION      Family History  Problem Relation Age of Onset  . Depression Mother   . Diabetes Mother   . Bipolar disorder Mother   . Heart disease Mother   . Hypertension Mother   . COPD Mother   . Psychiatric Illness Father   . Bipolar disorder Sister   . Lung cancer Maternal Aunt   . Lung cancer Maternal Aunt   . Heart disease Maternal Uncle   . Breast cancer Neg Hx     Social History   Socioeconomic History  . Marital status: Married    Spouse name: Not on file  . Number of children: 3  . Years of education: 63  . Highest education level: Not on file  Occupational History  . Occupation: Pharmacist, hospital  Tobacco Use  . Smoking status: Never Smoker  . Smokeless tobacco: Never Used  Vaping Use  . Vaping Use: Never used  Substance and Sexual Activity  . Alcohol use: Yes    Alcohol/week: 0.0 standard drinks    Comment: OCCASIONALLY  . Drug use: No  . Sexual activity: Yes    Partners: Male    Birth control/protection: I.U.D.  Other Topics Concern  . Not on file  Social History Narrative  . Not on file   Social Determinants of Health   Financial  Resource Strain:   . Difficulty of Paying Living Expenses:   Food Insecurity:   . Worried About Charity fundraiser in the Last Year:   . Arboriculturist in the Last Year:   Transportation Needs:   . Film/video editor (Medical):   Marland Kitchen Lack of Transportation (Non-Medical):   Physical Activity:   . Days of Exercise per Week:   . Minutes of Exercise per Session:   Stress:   . Feeling of Stress :   Social Connections:   . Frequency of Communication with Friends and Family:   . Frequency of Social Gatherings with Friends and Family:   . Attends Religious Services:   . Active Member of Clubs or Organizations:   . Attends Archivist Meetings:   Marland Kitchen Marital Status:   Intimate Partner Violence:   . Fear of Current or Ex-Partner:   . Emotionally Abused:   Marland Kitchen Physically Abused:   . Sexually Abused:     Outpatient Medications Prior to Visit  Medication Sig Dispense Refill  . FLUoxetine (PROZAC) 40 MG capsule Take 1 capsule (40 mg total) by mouth  daily. 90 capsule 3  . levonorgestrel (MIRENA, 52 MG,) 20 MCG/24HR IUD 1 each by Intrauterine route once.     No facility-administered medications prior to visit.      ROS:  Review of Systems BREAST: No symptoms   OBJECTIVE:   Vitals:  There were no vitals taken for this visit.  Physical Exam  Results: No results found for this or any previous visit (from the past 24 hour(s)).   Assessment/Plan: No diagnosis found.    No orders of the defined types were placed in this encounter.     No follow-ups on file.  Ellakate Gonsalves B. Brileigh Sevcik, PA-C 03/17/2020 7:28 PM

## 2020-03-18 ENCOUNTER — Ambulatory Visit: Payer: BC Managed Care – PPO | Admitting: Obstetrics and Gynecology

## 2020-04-21 NOTE — Progress Notes (Signed)
Gynecology Annual Exam  PCP: Mar Daring, PA-C  Chief Complaint:  Chief Complaint  Patient presents with  . Gynecologic Exam    excessive heavy bleeding for a few weks at a time, then nothing for a while - very random.    History of Present Illness: Allison Beard is a 49 y.o. G45P3003 White female who presents for her annual exam. The patient has been having problems with metromenorrhagia for years, but the bleeding became significantly heavier the end of February 2019. An ultrasoud revealed 2 fibroids, 3 and 6 cm in size , but not submucosal.  An endometrial biopsy was benign but consisted of mostly endometrial fragments. Her bleeding responded to oral norethindrone and she met with Dr Kenton Kingfisher to discuss her options. She had a Mirena IUD inserted April 2019. Had daily bleeding for a few months after it was inserted, but then had irregular lite menses or just spotting, until June of this year. She had bleeding theat raged from light bleeding to a heavy flow x 6 weeks. She finally stopped bleeding a couple of weeks ago. She had mild cramping and clots with her heavier flow days. She denies hot flashes.   Past medical history includes having a D&C and hysteroscopy for an endometrial polyp in 2008. Her PCP at [facility] is treating her for anxiety with Prozac. She had a left breast biopsy for a Birads 4 mammogram 04/24/2013 (calcifications) at [facility] which was benign. Her last mammogram was 06/04/2019 and was negative. There is no family history of breast cancer. She does not do self breast exams. There is a family history of ovarian cancer and genetic testing was not done. Last Pap smear was 02/15/2018  and was NIL. She has no history of abnormal Pap smears. She had a colonoscopy scheduled for last year , but appointment cancelled due to Covid. Has the box to collect Cologuard She does not use tobacco products. She drinks alcohol occasionally. She does exercise occasionally by walking. She may get  adequate calcium in her  diet.  She had a cholesterol screen 07/2015 and was borderline    Review of Systems: Review of Systems  Constitutional: Negative for chills and fever.  HENT: Negative for congestion, sinus pain and sore throat.   Eyes: Negative for blurred vision and pain.  Respiratory: Negative for hemoptysis, shortness of breath and wheezing.   Cardiovascular: Negative for chest pain, palpitations and leg swelling.  Gastrointestinal: Negative for abdominal pain, blood in stool, diarrhea, heartburn, nausea and vomiting.  Genitourinary: Negative for dysuria, frequency, hematuria and urgency.       Positive for menometrorrhagia  Musculoskeletal: Negative for back pain, joint pain and myalgias.  Skin: Negative for itching and rash.  Neurological: Negative for dizziness, tingling and headaches.  Endo/Heme/Allergies: Negative for environmental allergies and polydipsia. Does not bruise/bleed easily.       Negative for hirsutism   Psychiatric/Behavioral: Negative for depression. The patient is not nervous/anxious and does not have insomnia.     Past Medical History:  Past Medical History:  Diagnosis Date  . Anemia   . Anxiety   . Depression   . Dizziness   . Menorrhagia   . Uterine fibroid      Past Surgical History:  Past Surgical History:  Procedure Laterality Date  . BREAST BIOPSY Left 2015   neg-, neddle bx/clip and bx  . HYSTEROSCOPY WITH D & C  2008   endometrial polyp  . MM BREAST STEREO BIOPSY LEFT [facility]  HX)  04/26/2013  . ORIF ANKLE FRACTURE Right 1989  . WISDOM TOOTH EXTRACTION     Obstetric History: [id number]  Family History:  Family History  Problem Relation Age of Onset  . Depression Mother   . Diabetes Mother   . Bipolar disorder Mother   . Heart disease Mother   . Hypertension Mother   . COPD Mother   . Psychiatric Illness Father   . Bipolar disorder Sister   . Lung cancer Maternal Aunt   . Lung cancer Maternal Aunt   . Heart disease Maternal  Uncle   . Breast cancer Neg Hx     Social History:  Social History   Socioeconomic History  . Marital status: Married    Spouse name: Not on file  . Number of children: 3  . Years of education: 58  . Highest education level: Not on file  Occupational History  . Occupation: Pharmacist, hospital  Tobacco Use  . Smoking status: Never Smoker  . Smokeless tobacco: Never Used  Vaping Use  . Vaping Use: Never used  Substance and Sexual Activity  . Alcohol use: Yes    Alcohol/week: 0.0 standard drinks    Comment: OCCASIONALLY  . Drug use: No  . Sexual activity: Yes    Partners: Male    Birth control/protection: I.U.D.  Other Topics Concern  . Not on file  Social History Narrative  . Not on file   Social Determinants of Health   Financial Resource Strain:   . Difficulty of Paying Living Expenses:   Food Insecurity:   . Worried About Charity fundraiser in the Last Year:   . Arboriculturist in the Last Year:   Transportation Needs:   . Film/video editor (Medical):   Marland Kitchen Lack of Transportation (Non-Medical):   Physical Activity:   . Days of Exercise per Week:   . Minutes of Exercise per Session:   Stress:   . Feeling of Stress :   Social Connections:   . Frequency of Communication with Friends and Family:   . Frequency of Social Gatherings with Friends and Family:   . Attends Religious Services:   . Active Member of Clubs or Organizations:   . Attends Archivist Meetings:   Marland Kitchen Marital Status:   Intimate Partner Violence:   . Fear of Current or Ex-Partner:   . Emotionally Abused:   Marland Kitchen Physically Abused:   . Sexually Abused:     Allergies:  No Known Allergies  Medications:  Current Outpatient Medications on File Prior to Visit  Medication Sig Dispense Refill  . FLUoxetine (PROZAC) 40 MG capsule Take 1 capsule (40 mg total) by mouth daily. 90 capsule 3  . levonorgestrel (MIRENA, 52 MG,) 20 MCG/24HR IUD 1 each by Intrauterine route once.     No current  facility-administered medications on file prior to visit.   Physical Exam Vitals: BP 110/63   Pulse 68   Ht 5' 3"  (1.6 m)   Wt 159 lb (72.1 kg)   LMP 04/07/2020 Comment: heavy bleeding ? fibroid  BMI 28.17 kg/m  General: WF in NAD HEENT: normocephalic, anicteric Thyroid: no enlargement, no palpable nodules Pulmonary: No increased work of breathing, CTAB Cardiovascular: RRR without murmur Breast: Breast symmetrical, no tenderness, no palpable nodules or masses, no skin or nipple retraction present, no nipple discharge.  No axillary, infraclavicular, or supraclavicular lymphadenopathy. Abdomen: soft, non-tender, non-distended.  Umbilicus without lesions.  No hepatomegaly. Uterus palpable about half way between [facility]  and U. No evidence of hernia  Genitourinary:  External: Normal external female genitalia.  Normal urethral meatus, normal Bartholin's and Skene's glands.    Vagina: Normal vaginal mucosa, no evidence of prolapse, bloody mucous discharge   Cervix: parous and posterior and to the left, difficulty visualizing the cervix. IUD string was questionably palpated  at cervical os.  Uterus: 14 week size uterus, AV, NT  Adnexa: no masses, NT  Rectal: deferred  Lymphatic: no evidence of inguinal lymphadenopathy Extremities: no edema, erythema, or tenderness Neurologic: Grossly intact Psychiatric: mood appropriate, affect full      Assessment: 49 y.o. [id number] for annual gyn exam Metrorrhagia with recent episode of menorrhagia Fibroid uterus with ? 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 Will do every 3 years  3) AUB: Will get pelvic ultrasound to check endometrial strip, for growth of fibroids, and IUD position and have her follow up with Dr Kenton Kingfisher  4) CBC, FSH/LH,  hemoglobin A1C, lipid panel ordered-to return when fasting for labs  5) Discussed calcium and vitamin D3 requirements and recommended vitamin D3 supplements 1000 IU daily    Dalia Heading, CNM

## 2020-04-22 ENCOUNTER — Ambulatory Visit (INDEPENDENT_AMBULATORY_CARE_PROVIDER_SITE_OTHER): Payer: BC Managed Care – PPO | Admitting: Certified Nurse Midwife

## 2020-04-22 ENCOUNTER — Other Ambulatory Visit: Payer: Self-pay

## 2020-04-22 ENCOUNTER — Encounter: Payer: Self-pay | Admitting: Certified Nurse Midwife

## 2020-04-22 VITALS — BP 110/63 | HR 68 | Ht 63.0 in | Wt 159.0 lb

## 2020-04-22 DIAGNOSIS — Z01419 Encounter for gynecological examination (general) (routine) without abnormal findings: Secondary | ICD-10-CM

## 2020-04-22 DIAGNOSIS — N921 Excessive and frequent menstruation with irregular cycle: Secondary | ICD-10-CM | POA: Diagnosis not present

## 2020-04-22 DIAGNOSIS — D251 Intramural leiomyoma of uterus: Secondary | ICD-10-CM

## 2020-04-22 DIAGNOSIS — N939 Abnormal uterine and vaginal bleeding, unspecified: Secondary | ICD-10-CM | POA: Diagnosis not present

## 2020-04-22 DIAGNOSIS — Z13220 Encounter for screening for lipoid disorders: Secondary | ICD-10-CM

## 2020-04-22 DIAGNOSIS — D252 Subserosal leiomyoma of uterus: Secondary | ICD-10-CM

## 2020-04-22 DIAGNOSIS — Z1231 Encounter for screening mammogram for malignant neoplasm of breast: Secondary | ICD-10-CM

## 2020-04-22 DIAGNOSIS — Z131 Encounter for screening for diabetes mellitus: Secondary | ICD-10-CM | POA: Diagnosis not present

## 2020-04-23 ENCOUNTER — Other Ambulatory Visit: Payer: BC Managed Care – PPO

## 2020-04-24 ENCOUNTER — Other Ambulatory Visit: Payer: BC Managed Care – PPO

## 2020-04-24 ENCOUNTER — Other Ambulatory Visit: Payer: Self-pay

## 2020-04-24 DIAGNOSIS — N921 Excessive and frequent menstruation with irregular cycle: Secondary | ICD-10-CM

## 2020-04-24 DIAGNOSIS — Z13220 Encounter for screening for lipoid disorders: Secondary | ICD-10-CM

## 2020-04-24 DIAGNOSIS — N939 Abnormal uterine and vaginal bleeding, unspecified: Secondary | ICD-10-CM

## 2020-04-24 DIAGNOSIS — Z131 Encounter for screening for diabetes mellitus: Secondary | ICD-10-CM

## 2020-04-25 LAB — LIPID PANEL WITH LDL/HDL RATIO
Cholesterol, Total: 198 mg/dL (ref 100–199)
HDL: 51 mg/dL (ref >39)
LDL Chol Calc (NIH): 133 mg/dL — ABNORMAL HIGH (ref 0–99)
LDL/HDL Ratio: 2.6 ratio (ref 0.0–3.2)
Triglycerides: 78 mg/dL (ref 0–149)
VLDL Cholesterol Cal: 14 mg/dL (ref 5–40)

## 2020-04-25 LAB — CBC WITH DIFFERENTIAL/PLATELET
Basophils Absolute: 0 10*3/uL (ref 0.0–0.2)
Basos: 1 %
EOS (ABSOLUTE): 0.1 10*3/uL (ref 0.0–0.4)
Eos: 2 %
Hematocrit: 34.4 % (ref 34.0–46.6)
Hemoglobin: 11 g/dL — ABNORMAL LOW (ref 11.1–15.9)
Immature Grans (Abs): 0 10*3/uL (ref 0.0–0.1)
Immature Granulocytes: 0 %
Lymphocytes Absolute: 1.4 10*3/uL (ref 0.7–3.1)
Lymphs: 28 %
MCH: 27.7 pg (ref 26.6–33.0)
MCHC: 32 g/dL (ref 31.5–35.7)
MCV: 87 fL (ref 79–97)
Monocytes Absolute: 0.4 10*3/uL (ref 0.1–0.9)
Monocytes: 7 %
Neutrophils Absolute: 3.1 10*3/uL (ref 1.4–7.0)
Neutrophils: 62 %
Platelets: 243 10*3/uL (ref 150–450)
RBC: 3.97 x10E6/uL (ref 3.77–5.28)
RDW: 13 % (ref 11.7–15.4)
WBC: 5 10*3/uL (ref 3.4–10.8)

## 2020-04-25 LAB — HEMOGLOBIN A1C
Est. average glucose Bld gHb Est-mCnc: 126 mg/dL
Hgb A1c MFr Bld: 6 % — ABNORMAL HIGH (ref 4.8–5.6)

## 2020-04-25 LAB — FSH/LH
FSH: 60.9 m[IU]/mL
LH: 37.7 m[IU]/mL

## 2020-05-07 ENCOUNTER — Encounter: Payer: Self-pay | Admitting: Certified Nurse Midwife

## 2020-05-12 ENCOUNTER — Ambulatory Visit (INDEPENDENT_AMBULATORY_CARE_PROVIDER_SITE_OTHER): Payer: BC Managed Care – PPO | Admitting: Obstetrics & Gynecology

## 2020-05-12 ENCOUNTER — Ambulatory Visit (INDEPENDENT_AMBULATORY_CARE_PROVIDER_SITE_OTHER): Payer: BC Managed Care – PPO

## 2020-05-12 ENCOUNTER — Other Ambulatory Visit: Payer: Self-pay

## 2020-05-12 ENCOUNTER — Encounter: Payer: Self-pay | Admitting: Obstetrics & Gynecology

## 2020-05-12 ENCOUNTER — Other Ambulatory Visit: Payer: Self-pay | Admitting: Certified Nurse Midwife

## 2020-05-12 VITALS — BP 118/80 | Ht 63.0 in | Wt 158.0 lb

## 2020-05-12 DIAGNOSIS — N921 Excessive and frequent menstruation with irregular cycle: Secondary | ICD-10-CM | POA: Diagnosis not present

## 2020-05-12 DIAGNOSIS — D219 Benign neoplasm of connective and other soft tissue, unspecified: Secondary | ICD-10-CM | POA: Diagnosis not present

## 2020-05-12 NOTE — Progress Notes (Signed)
HPI: Pt has been having irreg bleeding, worse and heavy this past June and July, although better the last 3 weeks (spotty but not heavy).  No pain.  Has h/o fibroids.  Mirena placed 17 mos ago for management, has used in past as well, aslo for contraception.  No known expulsion.  Ultrasound demonstrates stable fibroids at similar size to last [place] 2 years ago.  No IUD seen.  Ovaries normal.  FSH/LH  Result Value Ref Range   LH 37.7 mIU/mL   FSH 60.9 mIU/mL    PMHx: She  has a past medical history of Anemia, Anxiety, Depression, Dizziness, Menorrhagia, and Uterine fibroid. Also,  has a past surgical history that includes MM BREAST STEREO BIOPSY LEFT ([facility] HX) (04/26/2013); Hysteroscopy with D & C (2008); Wisdom tooth extraction; ORIF ankle fracture (Right, 1989); and Breast biopsy (Left, 2015)., family history includes Bipolar disorder in her mother and sister; COPD in her mother; Depression in her mother; Diabetes in her mother; Heart disease in her maternal uncle and mother; Hypertension in her mother; Lung cancer in her maternal aunt and maternal aunt; Ovarian cancer (age of onset: 7) in her maternal aunt; Psychiatric Illness in her father.,  reports that she has never smoked. She has never used smokeless tobacco. She reports current alcohol use. She reports that she does not use drugs.  She has a current medication list which includes the following prescription(s): fluoxetine and levonorgestrel. Also, has No Known Allergies.  Review of Systems  All other systems reviewed and are negative.   Objective: BP 118/80    Ht 5\' 3"  (1.6 m)    Wt 158 lb (71.7 kg)    BMI 27.99 kg/m   Physical examination Constitutional NAD, Conversant  Skin No rashes, lesions or ulceration.   Extremities: Moves all appropriately.  Normal ROM for age. No lymphadenopathy.  Neuro: Grossly intact  Psych: Oriented to PPT.  Normal mood. Normal affect.   US PELVIC COMPLETE WITH TRANSVAGINAL  Result Date:  05/12/2020 Patient Name: Allison Beard DOB: 1971/01/10 MRN: [id number] ULTRASOUND REPORT Location: [facility] Date of Service: 05/12/2020 Indications:Abnormal Uterine Bleeding Findings: The uterus is retroflexed and measures 11.5 x 8.0 x 8.0 cm. Echo texture is heterogenous with evidence of focal masses. Within the uterus are multiple suspected fibroids measuring: Fibroid 1: 62.9 x 53.5 x 53.9 mm subserosal posterior fundal Fibroid 2: 27.0 x 23.4 x 25.2 mm intramural right The Endometrium measures 5.0 mm. No IUD is seen within the uterus. Right Ovary measures 2.3 x 1.4 x 1.3 cm. It is normal in appearance. Left Ovary measures 1.8 x 1.0 x 1.0 cm. It is normal in appearance. Survey of the adnexa demonstrates no adnexal masses. There is no free fluid in the cul de sac. Impression: 1. No obvious polyps or fibroids within the endometrium. 2. There are two uterine fibroids seen. 3. Normal ovaries. 4. No IUD is seen in the uterus. Recommendations: 1.Clinical correlation with the patient's History and Physical Exam. Gweneth Dimitri, RT Review of ULTRASOUND.    I have personally reviewed images and report of recent ultrasound done at [facility].    Plan of management to be discussed with patient. Barnett Applebaum, MD, [facility], [facility] 05/12/2020  5:18 PM   Assessment:  Menorrhagia with irregular cycle Fibroid IUD Expulsion  Options discussed for management of menorrhagia, that is now quite better: Replace IUD (likely expulsion as no reason to suspect perforation) as it controls sx's perhaps, although not sure  when expulsion occurred in conjunction w her sx's Leave IUD out (labs confirm low likelihood for fertility) and see how her pattern of bleeding is moving forward Hysterectomy, pros and cons of surgery for closure of fibroid concerns and sx's, with cons of surgery and recovery  Will leave out and monitor for now.  Replace or operate based on sx's and/or growth of fibroids.   Repeat US in a year or when sx's change.  A total of 20 minutes were spent face-to-face with the patient as well as preparation, review, communication, and documentation during this encounter.   Barnett Applebaum, MD, [facility], [facility] 05/12/2020  5:37 PM

## 2020-05-13 ENCOUNTER — Encounter: Payer: Self-pay | Admitting: Obstetrics and Gynecology

## 2020-10-01 ENCOUNTER — Other Ambulatory Visit: Payer: BC Managed Care – PPO

## 2020-10-15 ENCOUNTER — Telehealth: Payer: Self-pay

## 2020-10-15 NOTE — Telephone Encounter (Signed)
Copied from CRM #[id number]. Topic: Appointment Scheduling - Prior Auth Required for Appointment >> Oct 15, 2020  2:13 PM Coley, Everette A wrote: No appointment has been scheduled. Patient is requesting an in office appointment.  Per scheduling protocol, this appointment requires a prior authorization prior to scheduling.  Patient has virtual appointment scheduled for 10/16/20 at 4:00PM Patient would like to be seen in office but per decision tree the agent was unable to schedule that at the time of call.   Please contact patient to advise scheduling, patient will be unable to answer phone until after 3:15 PM Route to department's PEC pool. 

## 2020-10-15 NOTE — Telephone Encounter (Signed)
Copied from [place] [id number] [id number]. Topic: Appointment Scheduling - Prior Auth Required for Appointment >> Oct 15, 2020  2:13 PM Tessa Lerner A wrote: No appointment has been scheduled. Patient is requesting an in office appointment.  Per scheduling protocol, this appointment requires a prior authorization prior to scheduling.  Patient has virtual appointment scheduled for 10/16/20 at 4:00PM Patient would like to be seen in office but per decision tree the agent was unable to schedule that at the time of call.   Please contact patient to advise scheduling, patient will be unable to answer phone until after 3:15 PM Route to department's PEC pool.

## 2020-10-15 NOTE — Telephone Encounter (Signed)
Unfortunately if cough is less than 10 days she will need to be a virtual visit for now, unless she has had a covid PCR test and is negative.

## 2020-10-15 NOTE — Telephone Encounter (Signed)
Pt advised.  She would like to cancel the appointment for now.  I advised her to call back if she changes her mind.    Thanks,   -Mickel Baas

## 2020-10-16 ENCOUNTER — Telehealth: Payer: BC Managed Care – PPO | Admitting: Physician Assistant

## 2020-10-19 ENCOUNTER — Ambulatory Visit
Admission: EM | Admit: 2020-10-19 | Discharge: 2020-10-19 | Disposition: A | Discharge status: Home / Self Care | Payer: Self-pay | Attending: Emergency Medicine | Admitting: Emergency Medicine

## 2020-10-19 ENCOUNTER — Other Ambulatory Visit: Payer: Self-pay

## 2020-10-19 DIAGNOSIS — U099 Post covid-19 condition, unspecified: Secondary | ICD-10-CM

## 2020-10-19 DIAGNOSIS — H6123 Impacted cerumen, bilateral: Secondary | ICD-10-CM

## 2020-10-19 DIAGNOSIS — R0609 Other forms of dyspnea: Secondary | ICD-10-CM

## 2020-10-19 MED ORDER — BENZONATATE 100 MG PO CAPS: 200.0000 mg | ORAL_CAPSULE | Freq: Three times a day (TID) | ORAL | 0 refills | Status: DC

## 2020-10-19 MED ORDER — AEROCHAMBER MV MISC: 2 refills | Status: DC

## 2020-10-19 MED ORDER — PREDNISONE 10 MG (21) PO TBPK: ORAL_TABLET | Freq: Every day | ORAL | 0 refills | Status: DC

## 2020-10-19 MED ORDER — ALBUTEROL SULFATE HFA 108 (90 BASE) MCG/ACT IN AERS: 2.0000 | INHALATION_SPRAY | RESPIRATORY_TRACT | 0 refills | Status: DC | PRN

## 2020-10-19 NOTE — ED Provider Notes (Signed)
[facility]    CSN: [id number] Arrival date & time: 10/19/20  1434      History   Chief Complaint Chief Complaint  Patient presents with  . Cough    HPI Allison Beard is a 50 y.o. female.   HPI   50 year old female here for evaluation of multiple complaints.  Firstly, patient is complaining that she is continuing to feel lethargic and have a cough since being diagnosed with COVID on 09/24/2020.  Patient states that her cough is nonproductive and has had some shortness of breath.  Patient denies wheezing.  Secondly, patient is complaining that her ear nose feels clogged and that she has decreased hearing.  Patient denies any ringing in her ears, ear pain, or dizziness.  Patient has used over-the-counter Debrox at home without much result.  Past Medical History:  Diagnosis Date  . Anemia   . Anxiety   . Depression   . Dizziness   . Family history of ovarian cancer    9/21 cancer genetic testing letter sent  . Menorrhagia   . Uterine fibroid     Patient Active Problem List   Diagnosis Date Noted  . Depression 01/04/2019  . Anemia 12/19/2017  . Elevated blood sugar 03/11/2015  . Leiomyoma of uterus 03/11/2015  . Cephalalgia 03/11/2015  . Menometrorrhagia 03/11/2015  . Abnormal mammogram 05/27/2013  . Avitaminosis D 01/21/2010  . Anxiety disorder 10/07/2009  . Cannot sleep 10/07/2009    Past Surgical History:  Procedure Laterality Date  . BREAST BIOPSY Left 2015   neg-, neddle bx/clip and bx  . HYSTEROSCOPY WITH D & C  2008   endometrial polyp  . MM BREAST STEREO BIOPSY LEFT ([place] HX)  04/26/2013  . ORIF ANKLE FRACTURE Right 1989  . WISDOM TOOTH EXTRACTION      OB History    Gravida  4   Para  3   Term  3   Preterm      AB  1   Living  3     SAB  1   IAB      Ectopic      Multiple      Live Births  3            Home Medications    Prior to Admission medications   Medication Sig Start Date End Date Taking?  Authorizing Provider  albuterol (VENTOLIN HFA) 108 (90 Base) MCG/ACT inhaler Inhale 2 puffs into the lungs every 4 (four) hours as needed. 10/19/20  Yes Margarette Canada, NP  benzonatate (TESSALON) 100 MG capsule Take 2 capsules (200 mg total) by mouth every 8 (eight) hours. 10/19/20  Yes Margarette Canada, NP  FLUoxetine (PROZAC) 40 MG capsule Take 1 capsule (40 mg total) by mouth daily. 03/09/20  Yes Mar Daring, PA-C  predniSONE (STERAPRED UNI-PAK 21 TAB) 10 MG (21) TBPK tablet Take by mouth daily. Take 6 tabs by mouth daily  for 2 days, then 5 tabs for 2 days, then 4 tabs for 2 days, then 3 tabs for 2 days, 2 tabs for 2 days, then 1 tab by mouth daily for 2 days 10/19/20  Yes Margarette Canada, NP  Spacer/Aero-Holding Chambers (AEROCHAMBER MV) inhaler Use as instructed 10/19/20  Yes Margarette Canada, NP    Family History Family History  Problem Relation Age of Onset  . Depression Mother   . Diabetes Mother   . Bipolar disorder Mother   . Heart disease Mother   . Hypertension Mother   .  COPD Mother   . Psychiatric Illness Father   . Bipolar disorder Sister   . Lung cancer Maternal Aunt   . Lung cancer Maternal Aunt   . Heart disease Maternal Uncle   . Ovarian cancer Maternal Aunt 70  . Breast cancer Neg Hx     Social History Social History   Tobacco Use  . Smoking status: Never Smoker  . Smokeless tobacco: Never Used  Vaping Use  . Vaping Use: Never used  Substance Use Topics  . Alcohol use: Yes    Alcohol/week: 0.0 standard drinks    Comment: OCCASIONALLY  . Drug use: No     Allergies   Patient has no known allergies.   Review of Systems Review of Systems  Constitutional: Positive for fatigue. Negative for fever.  HENT: Positive for hearing loss. Negative for ear discharge, ear pain and tinnitus.   Respiratory: Positive for cough and shortness of breath.   Skin: Negative for rash.  Neurological: Negative for dizziness.  Hematological: Negative.   Psychiatric/Behavioral:  Negative.      Physical Exam Triage Vital Signs ED Triage Vitals  Enc Vitals Group     BP --      Pulse Rate 10/19/20 1449 72     Resp 10/19/20 1449 18     Temp 10/19/20 1449 98.5 F (36.9 C)     Temp Source 10/19/20 1449 Oral     SpO2 10/19/20 1449 98 %     Weight 10/19/20 1447 160 lb (72.6 kg)     Height 10/19/20 1447 5\' 3"  (1.6 m)     Head Circumference --      Peak Flow --      Pain Score 10/19/20 1447 0     Pain Loc --      Pain Edu? --      Excl. in [place]? --    No data found.  Updated Vital Signs BP (!) 94/47 (BP Location: Left Arm)   Pulse 72   Temp 98.5 F (36.9 C) (Oral)   Resp 18   Ht 5\' 3"  (1.6 m)   Wt 160 lb (72.6 kg)   LMP 09/26/2020   SpO2 98%   BMI 28.34 kg/m   Visual Acuity Right Eye Distance:   Left Eye Distance:   Bilateral Distance:    Right Eye Near:   Left Eye Near:    Bilateral Near:     Physical Exam Vitals and nursing note reviewed.  Constitutional:      General: She is not in acute distress.    Appearance: Normal appearance. She is not toxic-appearing.  HENT:     Head: Normocephalic and atraumatic.     Right Ear: There is impacted cerumen.     Left Ear: There is impacted cerumen.  Cardiovascular:     Rate and Rhythm: Normal rate and regular rhythm.     Pulses: Normal pulses.     Heart sounds: Normal heart sounds. No murmur heard. No gallop.   Pulmonary:     Effort: Pulmonary effort is normal.     Breath sounds: Normal breath sounds. No wheezing, rhonchi or rales.  Skin:    General: Skin is warm and dry.     Capillary Refill: Capillary refill takes less than 2 seconds.     Findings: No erythema or rash.  Neurological:     General: No focal deficit present.     Mental Status: She is alert.  Psychiatric:  Mood and Affect: Mood normal.        Behavior: Behavior normal.        Thought Content: Thought content normal.        Judgment: Judgment normal.      UC Treatments / Results  Labs (all labs ordered are  listed, but only abnormal results are displayed) Labs Reviewed - No data to display  EKG   Radiology No results found.  Procedures Procedures (including critical care time)  Medications Ordered in UC Medications - No data to display  Initial Impression / Assessment and Plan / UC Course  I have reviewed the triage vital signs and the nursing notes.  Pertinent labs & imaging results that were available during my care of the patient were reviewed by me and considered in my medical decision making (see chart for details).   Patient is here for evaluation of multiple complaints.  The first complaint is that she has had clogged ears with decreased hearing.  She is tried over-the-counter Debrox at home without much relief.  Physical exam reveals impacted cerumen in both ear canals.  Left external auditory canal was able to be cleared with a curette after removing a large volume of brown wax.  The tympanic membrane is pearly gray with normal light reflex.  No signs of infection.  Right AC unable to be cleared with curette.  Will order ear lavage and reassess.  Right tympanic membrane is pearly gray with a normal light reflex.  No sign of infection and no effusion present.   Patient second complaint is of continued cough and shortness of breath and lethargy following being diagnosed with COVID 3 weeks ago.  Patient is not tachypneic or dyspneic.  Lung sounds are clear to auscultation all fields.  Suspect patient has post Covid dyspnea.  Will treat with Tessalon Perles, prednisone, and albuterol inhaler with spacer.   Final Clinical Impressions(s) / UC Diagnoses   Final diagnoses:  Bilateral impacted cerumen  Post-COVID chronic dyspnea     Discharge Instructions     Use the albuterol inhaler with a spacer, 2 puffs every 4-6 hours, as needed for shortness of breath.  Use the Tessalon Perles every 8 hours as needed for cough.  Take the prednisone according to the package insert to help  you with shortness of breath and fatigue.  If your symptoms last longer than 6 weeks talk with your primary care provider about a referral to one of the Covid long-haul clinics at [place], [facility], or [facility].    ED Prescriptions    Medication Sig Dispense Auth. Provider   albuterol (VENTOLIN HFA) 108 (90 Base) MCG/ACT inhaler Inhale 2 puffs into the lungs every 4 (four) hours as needed. 18 g Margarette Canada, NP   Spacer/Aero-Holding Chambers (AEROCHAMBER MV) inhaler Use as instructed 1 each Margarette Canada, NP   predniSONE (STERAPRED UNI-PAK 21 TAB) 10 MG (21) TBPK tablet Take by mouth daily. Take 6 tabs by mouth daily  for 2 days, then 5 tabs for 2 days, then 4 tabs for 2 days, then 3 tabs for 2 days, 2 tabs for 2 days, then 1 tab by mouth daily for 2 days 42 tablet Margarette Canada, NP   benzonatate (TESSALON) 100 MG capsule Take 2 capsules (200 mg total) by mouth every 8 (eight) hours. 21 capsule Margarette Canada, NP     PDMP not reviewed this encounter.   Margarette Canada, NP 10/19/20 1534

## 2020-10-19 NOTE — ED Triage Notes (Signed)
Patient states that she was diagnosed with covid on January 13th. States that she has continued to have a cough and feel lethargic. Reports that she also feels pressure in both ears and intensity varies daily.

## 2020-10-19 NOTE — Discharge Instructions (Addendum)
Use the albuterol inhaler with a spacer, 2 puffs every 4-6 hours, as needed for shortness of breath.  Use the Tessalon Perles every 8 hours as needed for cough.  Take the prednisone according to the package insert to help you with shortness of breath and fatigue.  If your symptoms last longer than 6 weeks talk with your primary care provider about a referral to one of the Covid long-haul clinics at [place], [facility], or [facility].

## 2021-02-19 ENCOUNTER — Other Ambulatory Visit: Payer: Self-pay | Admitting: Physician Assistant

## 2021-02-19 DIAGNOSIS — F411 Generalized anxiety disorder: Secondary | ICD-10-CM

## 2021-02-19 DIAGNOSIS — F3342 Major depressive disorder, recurrent, in full remission: Secondary | ICD-10-CM

## 2021-02-26 ENCOUNTER — Other Ambulatory Visit: Payer: Self-pay | Admitting: Family Medicine

## 2021-02-26 DIAGNOSIS — F3342 Major depressive disorder, recurrent, in full remission: Secondary | ICD-10-CM

## 2021-02-26 DIAGNOSIS — F411 Generalized anxiety disorder: Secondary | ICD-10-CM

## 2021-02-26 NOTE — Telephone Encounter (Signed)
Copied from [place] [id number]. Topic: Quick Communication - Rx Refill/Question >> Feb 26, 2021 10:35 AM Yvette Rack wrote: Medication: FLUoxetine (PROZAC) 40 MG capsule  Has the patient contacted their pharmacy? Yes.   (Agent: If no, request that the patient contact the pharmacy for the refill.) (Agent: If yes, when and what did the pharmacy advise?)  Preferred Pharmacy (with phone number or street name): [facility] [place], [place] - [address] Phone: [phone]  Fax: [phone]  Agent: Please be advised that RX refills may take up to 3 business days. We ask that you follow-up with your pharmacy.

## 2021-02-26 NOTE — Telephone Encounter (Signed)
Requested medication (s) are due for refill today:  yes  Requested medication (s) are on the active medication list:  yes   Future visit scheduled: no  Notes to clinic:  patient is overdue for follow up appt and labs     Requested Prescriptions  Pending Prescriptions Disp Refills   FLUoxetine (PROZAC) 40 MG capsule 90 capsule 3    Sig: Take 1 capsule (40 mg total) by mouth daily.      Psychiatry:  Antidepressants - SSRI Failed - 02/26/2021 10:39 AM      Failed - Valid encounter within last 6 months    Recent Outpatient Visits           11 months ago Colon cancer screening   [name], [name], [place]   2 years ago Generalized anxiety disorder   [name], [name], [place]   3 years ago Depression, unspecified depression type   [name], [name], [place]   4 years ago [place], [name], [place]   4 years ago Depression, unspecified depression type   [name], [place]                Passed - Completed PHQ-2 or PHQ-9 in the last 360 days

## 2021-03-01 MED ORDER — FLUOXETINE HCL 40 MG PO CAPS: 40.0000 mg | ORAL_CAPSULE | Freq: Every day | ORAL | 0 refills | Status: DC

## 2021-03-30 ENCOUNTER — Other Ambulatory Visit: Payer: Self-pay | Admitting: Family Medicine

## 2021-03-30 DIAGNOSIS — F3342 Major depressive disorder, recurrent, in full remission: Secondary | ICD-10-CM

## 2021-03-30 DIAGNOSIS — F411 Generalized anxiety disorder: Secondary | ICD-10-CM

## 2021-03-30 NOTE — Telephone Encounter (Signed)
Requested medications are due for refill today.  yes  Requested medications are on the active medications list.  yes  Last refill. 03/01/2021  Future visit scheduled.   no  Notes to clinic.  Courtesy refill already given.

## 2021-04-04 DIAGNOSIS — F3342 Major depressive disorder, recurrent, in full remission: Secondary | ICD-10-CM | POA: Insufficient documentation

## 2021-04-05 ENCOUNTER — Other Ambulatory Visit: Payer: Self-pay

## 2021-04-05 ENCOUNTER — Encounter: Payer: Self-pay | Admitting: Family Medicine

## 2021-04-05 ENCOUNTER — Ambulatory Visit: Payer: BC Managed Care – PPO | Admitting: Family Medicine

## 2021-04-05 VITALS — BP 108/64 | HR 69 | Temp 98.1°F | Resp 16 | Wt 157.8 lb

## 2021-04-05 DIAGNOSIS — F411 Generalized anxiety disorder: Secondary | ICD-10-CM

## 2021-04-05 DIAGNOSIS — D649 Anemia, unspecified: Secondary | ICD-10-CM

## 2021-04-05 DIAGNOSIS — F3342 Major depressive disorder, recurrent, in full remission: Secondary | ICD-10-CM | POA: Diagnosis not present

## 2021-04-05 DIAGNOSIS — E559 Vitamin D deficiency, unspecified: Secondary | ICD-10-CM

## 2021-04-05 MED ORDER — FLUOXETINE HCL 40 MG PO CAPS: 40.0000 mg | ORAL_CAPSULE | Freq: Every day | ORAL | 1 refills | Status: DC

## 2021-04-05 NOTE — Progress Notes (Signed)
Established patient visit   Patient: Allison Beard   DOB: 15-Nov-1970   50 y.o. Female  MRN: [id number] Visit Date: 04/05/2021  Today's healthcare provider: [name], FNP   Chief Complaint  Patient presents with   Anxiety   Depression   Subjective    HPI  Anxiety, Follow-up  She was last seen for anxiety 2 years ago. Changes made at last visit include none, condition stable on Fluoxetine 40 mg.   She reports excellent compliance with treatment. She reports excellent tolerance of treatment. She is not having side effects.   She feels her anxiety is mild and Unchanged since last visit. 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  Symptoms: No chest pain No difficulty concentrating  No dizziness Yes fatigue  No feelings of losing control No insomnia  No irritable No palpitations  No panic attacks No racing thoughts  No shortness of breath No sweating  Yes tremors/shakes    GAD-7 Results GAD-7 Generalized Anxiety Disorder Screening Tool 03/09/2020  1. Feeling Nervous, Anxious, or on Edge 1  2. Not Being Able to Stop or Control Worrying 0  3. Worrying Too Much About Different Things 0  4. Trouble Relaxing 1  5. Being So Restless it's Hard To Sit Still 0  6. Becoming Easily Annoyed or Irritable 1  7. Feeling Afraid As If Something Awful Might Happen 1  Total GAD-7 Score 4  Difficulty At Work, Home, or Getting  Along With Others? Not difficult at all    PHQ-9 Scores PHQ9 SCORE ONLY 04/05/2021 03/09/2020 01/04/2019  PHQ-9 Total Score '2 4 3    '$ ---------------------------------------------------------------------------------------------------  Depression, Follow-up  She  was last seen for this 2 years ago. Changes made at last visit include none, stable on Fluoxetine 40 mg.   She reports excellent compliance with treatment. She is not having side effects.   She reports excellent tolerance of  treatment. Current symptoms include:  none She feels she is Unchanged since last visit.  Depression screen [facility] 2/9 04/05/2021 03/09/2020 01/04/2019  Decreased Interest 0 1 0  Down, Depressed, Hopeless 0 0 0  PHQ - 2 Score 0 1 0  Altered sleeping '1 1 1  '$ Tired, decreased energy '1 2 1  '$ Change in appetite 0 0 1  Feeling bad or failure about yourself  0 0 0  Trouble concentrating 0 0 0  Moving slowly or fidgety/restless 0 0 0  Suicidal thoughts 0 0 0  PHQ-9 Score '2 4 3  '$ Difficult doing work/chores Not difficult at all Not difficult at all Not difficult at all    -----------------------------------------------------------------------------------------  Patient Active Problem List   Diagnosis Date Noted   Recurrent major depressive disorder, in full remission ([place]) 04/04/2021   Depression 01/04/2019   Anemia 12/19/2017   Elevated blood sugar 03/11/2015   Leiomyoma of uterus 03/11/2015   Cephalalgia 03/11/2015   Menometrorrhagia 03/11/2015   Abnormal mammogram 05/27/2013   Vitamin D deficiency, unspecified 01/21/2010   Anxiety disorder 10/07/2009   Cannot sleep 10/07/2009   Past Medical History:  Diagnosis Date   Anemia    Anxiety    Depression    Dizziness    Family history of ovarian cancer    9/21 cancer genetic testing letter sent   Menorrhagia    Uterine fibroid    No Known Allergies     Medications: Outpatient Medications Prior to Visit  Medication Sig   [DISCONTINUED] FLUoxetine (PROZAC) 40 MG capsule Take 1 capsule (40 mg total) by mouth daily. Patient needs an appointment before further refills can be given   [DISCONTINUED] albuterol (VENTOLIN HFA) 108 (90 Base) MCG/ACT inhaler Inhale 2 puffs into the lungs every 4 (four) hours as needed.   [DISCONTINUED] benzonatate (TESSALON) 100 MG capsule Take 2 capsules (200 mg total) by mouth every 8 (eight) hours.   [DISCONTINUED] predniSONE (STERAPRED UNI-PAK 21 TAB) 10 MG (21) TBPK tablet Take by mouth daily. Take 6 tabs by  mouth daily  for 2 days, then 5 tabs for 2 days, then 4 tabs for 2 days, then 3 tabs for 2 days, 2 tabs for 2 days, then 1 tab by mouth daily for 2 days   [DISCONTINUED] Spacer/Aero-Holding Chambers (AEROCHAMBER MV) inhaler Use as instructed   No facility-administered medications prior to visit.    Review of Systems      Objective    BP 108/64   Pulse 69   Temp 98.1 F (36.7 C) (Oral)   Resp 16   Wt 157 lb 12.8 oz (71.6 kg)   SpO2 98%   BMI 27.95 kg/m  BP Readings from Last 3 Encounters:  04/05/21 108/64  10/19/20 (!) 94/47  05/12/20 118/80   Wt Readings from Last 3 Encounters:  04/05/21 157 lb 12.8 oz (71.6 kg)  10/19/20 160 lb (72.6 kg)  05/12/20 158 lb (71.7 kg)   SpO2 Readings from Last 3 Encounters:  04/05/21 98%  10/19/20 98%  01/05/18 97%       Physical Exam  Pt in no acute distress, well appearing. Here for anxiety follow-up, has been stable on medication for years.  Sees GYN for her physical/annual exam.  Previous respiratory complications have resolved therefore medications have been discontinued.  No results found for any visits on 04/05/21.  Assessment & Plan     Problem List Items Addressed This Visit       Other   Anxiety disorder - Primary    Well controlled, worried about school starting. 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 Has roughly 10 years left, pt noted that 'she may not make it that long.' Discussed mindfulness and self-care rituals in anticipation to school year starting. Stable on oral medication, screening levels stable.        Relevant Medications   FLUoxetine (PROZAC) 40 MG capsule   Recurrent major depressive disorder, in full remission ([place])    No depressive symptoms Anxiety well managed and stable       Relevant Medications   FLUoxetine (PROZAC) 40 MG capsule     Return in about 3 months (around 07/06/2021) for annual examination.  Pt has wellness visit planned with GYN check; encouraged to schedule with PCP for screening purposes, lab work and vaccination needs.  Candice Camp Rollene Rotunda, FNP, reviewed patient's history and performed today's examination including assessment and plan.        [name], [place] [phone] (phone) [phone] (fax)  [place]

## 2021-04-05 NOTE — Assessment & Plan Note (Addendum)
Well controlled, worried about school starting. 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 Has roughly 10 years left, pt noted that 'she may not make it that long.' Discussed mindfulness and self-care rituals in anticipation to school year starting. Stable on oral medication, screening levels stable.

## 2021-04-05 NOTE — Assessment & Plan Note (Addendum)
No depressive symptoms Anxiety well managed and stable

## 2021-05-07 ENCOUNTER — Ambulatory Visit: Payer: Self-pay | Admitting: Obstetrics

## 2021-06-02 ENCOUNTER — Encounter: Payer: Self-pay | Admitting: Obstetrics

## 2021-06-02 ENCOUNTER — Ambulatory Visit (INDEPENDENT_AMBULATORY_CARE_PROVIDER_SITE_OTHER): Payer: BC Managed Care – PPO | Admitting: Obstetrics

## 2021-06-02 ENCOUNTER — Other Ambulatory Visit: Payer: Self-pay

## 2021-06-02 VITALS — BP 110/60 | HR 74 | Ht 63.0 in | Wt 158.0 lb

## 2021-06-02 DIAGNOSIS — Z1231 Encounter for screening mammogram for malignant neoplasm of breast: Secondary | ICD-10-CM

## 2021-06-02 DIAGNOSIS — Z124 Encounter for screening for malignant neoplasm of cervix: Secondary | ICD-10-CM

## 2021-06-02 DIAGNOSIS — N921 Excessive and frequent menstruation with irregular cycle: Secondary | ICD-10-CM | POA: Diagnosis not present

## 2021-06-02 DIAGNOSIS — Z01419 Encounter for gynecological examination (general) (routine) without abnormal findings: Secondary | ICD-10-CM | POA: Diagnosis not present

## 2021-06-02 DIAGNOSIS — N939 Abnormal uterine and vaginal bleeding, unspecified: Secondary | ICD-10-CM

## 2021-06-02 DIAGNOSIS — D219 Benign neoplasm of connective and other soft tissue, unspecified: Secondary | ICD-10-CM

## 2021-06-02 NOTE — Progress Notes (Signed)
Gynecology Annual Exam  PCP: Mar Daring, PA-C  Chief Complaint:  Chief Complaint  Patient presents with   Gynecologic Exam    Annual - no concerns. RM 5    History of Present Illness:Patient is a 50 y.o. [id number] presents for annual exam. The patient has a few complaints today. She was last seen a year ago by Dr. Kenton Kingfisher to address her heavier bleeding after her IUD was removed.She has debated replacing the IUD, but also has fibroids, and would like to discuss treatment for this.  She is a Theatre manager of second grade; is married , and has three children, ages: 87,25, and 40.  LMP: Patient's last menstrual period was 05/29/2021. Menarche:not applicable Average Interval: regular, 30 days Duration of flow: 7 days Heavy Menses: occasionally, varies Clots: occcasionally Intermenstrual Bleeding: no Postcoital Bleeding: no Dysmenorrhea: no  The patient is sexually active. She denies dyspareunia.  The patient does perform self breast exams.  There is no notable family history of breast or ovarian cancer in her family.  The patient wears seatbelts: yes.   The patient has regular exercise: not asked.    The patient denies current symptoms of depression.     Review of Systems: Review of Systems  Constitutional: Negative.   HENT: Negative.    Eyes: Negative.   Respiratory: Negative.    Cardiovascular: Negative.   Gastrointestinal: Negative.   Genitourinary: Negative.   Musculoskeletal: Negative.   Skin: Negative.   Neurological: Negative.   Endo/Heme/Allergies: Negative.   Psychiatric/Behavioral: Negative.     Past Medical History:  Patient Active Problem List   Diagnosis Date Noted   Recurrent major depressive disorder, in full remission ([place]) 04/04/2021   Depression 01/04/2019   Anemia 12/19/2017    Due to menometrorrhagia    Elevated blood sugar 03/11/2015   Leiomyoma of uterus 03/11/2015   Cephalalgia 03/11/2015   Menometrorrhagia 03/11/2015   Abnormal  mammogram 05/27/2013   Vitamin D deficiency, unspecified 01/21/2010   Anxiety disorder 10/07/2009   Cannot sleep 10/07/2009    Past Surgical History:  Past Surgical History:  Procedure Laterality Date   BREAST BIOPSY Left 2015   neg-, neddle bx/clip and bx   HYSTEROSCOPY WITH D & C  2008   endometrial polyp   MM BREAST STEREO BIOPSY LEFT ([place] [facility])  04/26/2013   ORIF ANKLE FRACTURE Right 1989   WISDOM TOOTH EXTRACTION      Gynecologic History:  Patient's last menstrual period was 05/29/2021. Last Pap: Results were: NILM no abnormalities  Last mammogram: 2021 Results were: BI-RAD I  Obstetric History: [id number]  Family History:  Family History  Problem Relation Age of Onset   Depression Mother    Diabetes Mother    Bipolar disorder Mother    Heart disease Mother    Hypertension Mother    COPD Mother    Psychiatric Illness Father    Bipolar disorder Sister    Lung cancer Maternal Aunt    Lung cancer Maternal Aunt    Heart disease Maternal Uncle    Ovarian cancer Maternal Aunt 70   Breast cancer Neg Hx     Social History:  Social History   Socioeconomic History   Marital status: Married    Spouse name: Not on file   Number of children: 3   Years of education: 16   Highest education level: Not on file  Occupational History   Occupation: Pharmacist, hospital  Tobacco Use   Smoking status: Never   Smokeless  tobacco: Never  Vaping Use   Vaping Use: Never used  Substance and Sexual Activity   Alcohol use: Yes    Alcohol/week: 0.0 standard drinks    Comment: OCCASIONALLY   Drug use: No   Sexual activity: Yes    Partners: Male    Birth control/protection: None  Other Topics Concern   Not on file  Social History Narrative   Not on file   Social Determinants of Health   Financial Resource Strain: Not on file  Food Insecurity: Not on file  Transportation Needs: Not on file  Physical Activity: Not on file  Stress: Not on file  Social Connections: Not on file   Intimate Partner Violence: Not on file    Allergies:  No Known Allergies  Medications: Prior to Admission medications   Medication Sig Start Date End Date Taking? Authorizing Provider  FLUoxetine (PROZAC) 40 MG capsule Take 1 capsule (40 mg total) by mouth daily for 180 doses. 04/05/21 10/02/21  [name], FNP    Physical Exam Vitals: Blood pressure 110/60, pulse 74, height 5\' 3"  (1.6 m), weight 158 lb (71.7 kg), last menstrual period 05/29/2021.  General: NAD HEENT: normocephalic, anicteric Thyroid: no enlargement, no palpable nodules Pulmonary: No increased work of breathing, CTAB Cardiovascular: RRR, distal pulses 2+ Breast: Breast symmetrical, no tenderness, no palpable nodules or masses, no skin or nipple retraction present, no nipple discharge.  No axillary or supraclavicular lymphadenopathy. Abdomen: NABS, soft, non-tender, non-distended.  Umbilicus without lesions.  No hepatomegaly, splenomegaly or masses palpable. No evidence of hernia  Genitourinary:  External: Normal external female genitalia.  Normal urethral meatus, normal Bartholin's and Skene's glands.    Vagina: Normal vaginal mucosa, no evidence of prolapse.    Cervix: Grossly normal in appearance, no bleeding  Uterus: Non-enlarged, mobile, normal contour.  No CMT  Adnexa: ovaries non-enlarged, no adnexal masses  Rectal: deferred  Lymphatic: no evidence of inguinal lymphadenopathy Extremities: no edema, erythema, or tenderness Neurologic: Grossly intact Psychiatric: mood appropriate, affect full  Female chaperone present for pelvic and breast  portions of the physical exam     Assessment: 50 y.o. [id number] routine annual exam  Plan: Problem List Items Addressed This Visit   None Visit Diagnoses     Menorrhagia with irregular cycle    -  Primary   Relevant Orders   US PELVIS (TRANSABDOMINAL ONLY)   Women's annual routine gynecological examination       Cervical cancer screening       Encounter for  screening mammogram for malignant neoplasm of breast       Relevant Orders   MM DIGITAL SCREENING BILATERAL   Abnormal uterine bleeding       Fibroid       Relevant Orders   US PELVIS (TRANSABDOMINAL ONLY)       1) Mammogram - recommend yearly screening mammogram.  Mammogram Was ordered today  2) STI screening  wasoffered and declined  3) ASCCP guidelines and rational discussed.  Patient opts for every 3 years screening interval  4) Osteoporosis  - per USPTF routine screening DEXA at age 61   Consider FDA-approved medical therapies in postmenopausal women and men aged 8 years and older, based on the following: a) A hip or vertebral (clinical or morphometric) fracture b) T-score ? -2.5 at the femoral neck or spine after appropriate evaluation to exclude secondary causes C) Low bone mass (T-score between -1.0 and -2.5 at the femoral neck or spine) and a 10-year probability of  a hip fracture ? 3% or a 10-year probability of a major osteoporosis-related fracture ? 20% based on the [place]-adapted WHO algorithm   5) Routine healthcare maintenance including cholesterol, diabetes screening discussed managed by PCP  6) Colonoscopy .  Screening recommended starting at age 16 for average risk individuals, age 32 for individuals deemed at increased risk (including African Americans) and recommended to continue until age 37.  For patient age 8-85 individualized approach is recommended.  Gold standard screening is via colonoscopy, Cologuard screening is an acceptable alternative for patient unwilling or unable to undergo colonoscopy.  "Colorectal cancer screening for average?risk adults: 2018 guideline update from the American Cancer Society"CA: A Cancer Journal for Clinicians: Feb 08, 2017   7) We discussed treatment for her fibroid uterus, which was identified last year. I have ordered a pelvic ultrasound, and also a referral to Dr. Kenton Kingfisher. Encouraged her to consider options, which might include  another iud, or hysterectomy. She is concerned about losing her ovaries- I stress that she would not have her ovaries removed per se. Return in about 4 weeks (around 06/30/2021) for appointment with Kenton Kingfisher to discuss treatment of her fibroids and bleeding.Imagene Riches, CNM  06/02/2021 5:14 PM   [facility], [facility] 06/02/2021, 5:14 PM

## 2021-06-17 ENCOUNTER — Ambulatory Visit: Admission: RE | Admit: 2021-06-17 | Payer: BC Managed Care – PPO | Source: Ambulatory Visit

## 2021-06-17 ENCOUNTER — Other Ambulatory Visit: Payer: Self-pay | Admitting: Obstetrics & Gynecology

## 2021-06-17 DIAGNOSIS — D219 Benign neoplasm of connective and other soft tissue, unspecified: Secondary | ICD-10-CM

## 2021-07-01 ENCOUNTER — Ambulatory Visit: Payer: BC Managed Care – PPO | Admitting: Obstetrics

## 2021-08-03 ENCOUNTER — Encounter: Payer: Self-pay | Admitting: Emergency Medicine

## 2021-08-03 ENCOUNTER — Ambulatory Visit: Payer: Self-pay

## 2021-08-03 ENCOUNTER — Ambulatory Visit
Admission: EM | Admit: 2021-08-03 | Discharge: 2021-08-03 | Disposition: A | Discharge status: Home / Self Care | Payer: BC Managed Care – PPO | Attending: Family Medicine | Admitting: Family Medicine

## 2021-08-03 DIAGNOSIS — J069 Acute upper respiratory infection, unspecified: Secondary | ICD-10-CM

## 2021-08-03 DIAGNOSIS — Z1152 Encounter for screening for COVID-19: Secondary | ICD-10-CM

## 2021-08-03 MED ORDER — PROMETHAZINE-DM 6.25-15 MG/5ML PO SYRP: 5.0000 mL | ORAL_SOLUTION | Freq: Four times a day (QID) | ORAL | 0 refills | Status: DC | PRN

## 2021-08-03 MED ORDER — LEVOCETIRIZINE DIHYDROCHLORIDE 5 MG PO TABS: 5.0000 mg | ORAL_TABLET | Freq: Every evening | ORAL | 0 refills | Status: DC

## 2021-08-03 MED ORDER — ALBUTEROL SULFATE HFA 108 (90 BASE) MCG/ACT IN AERS
2.0000 | INHALATION_SPRAY | Freq: Four times a day (QID) | RESPIRATORY_TRACT | Status: DC | PRN
  Administered 2021-08-03: 2 via RESPIRATORY_TRACT

## 2021-08-03 NOTE — Discharge Instructions (Addendum)
Albuterol inhaler 2 puffs every 4-6 hours for chest tightness, shortness of breath or wheezing. Take all other medication as prescribed.   Your COVID 19 results should result within 3-5 days. Negative results are immediately resulted to Mychart. Positive results will receive a follow-up call from our clinic. If symptoms are present, I recommend home quarantine until results are known.  Alternate Tylenol and ibuprofen as needed for body aches and fever.  Symptom management per recommendations discussed today.  If any breathing difficulty or chest pain develops go immediately to the closest emergency department for evaluation.

## 2021-08-03 NOTE — Telephone Encounter (Signed)
Pt called saying she is having chills, not sure about fever. cough and  fatigue and body aches.  Has not taken a covid tst yet.   She called to be seen at [facility] .  /no appts avail today   CB#  [phone]  Left message to call back. Appears pt. May be at [facility]

## 2021-08-03 NOTE — ED Triage Notes (Addendum)
Pt c/o cough, chest congestion, difficulty getting a full breath, HA, bodyaches, and chills x 3 days

## 2021-08-03 NOTE — Telephone Encounter (Signed)
2nd attempt to reach pt. "VM not set up yet." Unable to leave message to CB.

## 2021-08-05 LAB — COVID-19, FLU A+B NAA
Influenza A, NAA: NOT DETECTED
Influenza B, NAA: NOT DETECTED
SARS-CoV-2, NAA: NOT DETECTED

## 2021-08-30 ENCOUNTER — Other Ambulatory Visit: Payer: Self-pay | Admitting: Family Medicine

## 2021-10-19 ENCOUNTER — Other Ambulatory Visit: Payer: Self-pay | Admitting: Family Medicine

## 2021-10-19 DIAGNOSIS — F411 Generalized anxiety disorder: Secondary | ICD-10-CM

## 2021-10-19 DIAGNOSIS — F3342 Major depressive disorder, recurrent, in full remission: Secondary | ICD-10-CM

## 2021-10-19 MED ORDER — FLUOXETINE HCL 40 MG PO CAPS: 40.0000 mg | ORAL_CAPSULE | Freq: Every day | ORAL | 0 refills | Status: DC

## 2021-10-20 ENCOUNTER — Other Ambulatory Visit: Payer: Self-pay | Admitting: Family Medicine

## 2021-10-20 DIAGNOSIS — F411 Generalized anxiety disorder: Secondary | ICD-10-CM

## 2021-10-20 DIAGNOSIS — F3342 Major depressive disorder, recurrent, in full remission: Secondary | ICD-10-CM

## 2021-10-20 NOTE — Telephone Encounter (Signed)
Duplicate. Requested Prescriptions  Pending Prescriptions Disp Refills   FLUoxetine (PROZAC) 40 MG capsule [Pharmacy Med Name: FLUOXETINE 40MG  CAPSULES] 90 capsule     Sig: TAKE ONE CAPSULE BY MOUTH DAILY     Psychiatry:  Antidepressants - SSRI Failed - 10/20/2021  3:12 AM      Failed - Valid encounter within last 6 months    Recent Outpatient Visits          6 months ago Generalized anxiety disorder   [facility] [name], FNP   1 year ago Colon cancer screening   [place], [name], [place]   2 years ago Generalized anxiety disorder   [place], [place], [place]   3 years ago Depression, unspecified depression type   [place], [place], [place]   4 years ago [place], [place], [place]             Passed - Completed PHQ-2 or PHQ-9 in the last 360 days

## 2021-10-25 ENCOUNTER — Ambulatory Visit: Payer: BC Managed Care – PPO | Admitting: Family Medicine

## 2021-10-25 ENCOUNTER — Encounter: Payer: Self-pay | Admitting: Family Medicine

## 2021-10-25 ENCOUNTER — Other Ambulatory Visit: Payer: Self-pay

## 2021-10-25 VITALS — BP 123/57 | HR 71 | Temp 98.0°F | Resp 16 | Ht 63.0 in | Wt 162.4 lb

## 2021-10-25 DIAGNOSIS — F3342 Major depressive disorder, recurrent, in full remission: Secondary | ICD-10-CM | POA: Diagnosis not present

## 2021-10-25 DIAGNOSIS — F411 Generalized anxiety disorder: Secondary | ICD-10-CM

## 2021-10-25 DIAGNOSIS — Z1211 Encounter for screening for malignant neoplasm of colon: Secondary | ICD-10-CM | POA: Diagnosis not present

## 2021-10-25 DIAGNOSIS — Z1231 Encounter for screening mammogram for malignant neoplasm of breast: Secondary | ICD-10-CM

## 2021-10-25 MED ORDER — FLUOXETINE HCL 40 MG PO CAPS: 40.0000 mg | ORAL_CAPSULE | Freq: Every day | ORAL | 3 refills | Status: DC

## 2021-10-25 NOTE — Assessment & Plan Note (Signed)
Doing well on current regimen, no changes made today. 

## 2021-10-25 NOTE — Progress Notes (Signed)
° °  SUBJECTIVE:   CHIEF COMPLAINT / HPI:   Anxiety/Depression - Medications: prozac - Taking: good compliance - Counseling: not currently - Previous hospitalizations: no - Symptoms: none - Current stressors: job Merchant navy officer) - Coping Mechanisms: reading, spending time with family  Depression screen [facility] 2/9 10/25/2021 04/05/2021 03/09/2020  Decreased Interest 0 0 1  Down, Depressed, Hopeless 0 0 0  PHQ - 2 Score 0 0 1  Altered sleeping 0 1 1  Tired, decreased energy 1 1 2   Change in appetite 0 0 0  Feeling bad or failure about yourself  1 0 0  Trouble concentrating 0 0 0  Moving slowly or fidgety/restless 0 0 0  Suicidal thoughts 0 0 0  PHQ-9 Score 2 2 4   Difficult doing work/chores Not difficult at all Not difficult at all Not difficult at all   GAD 7 : Generalized Anxiety Score 10/25/2021 03/09/2020  Nervous, Anxious, on Edge 1 1  Control/stop worrying 0 0  Worry too much - different things 0 0  Trouble relaxing 1 1  Restless 0 0  Easily annoyed or irritable 1 1  Afraid - awful might happen 0 1  Total GAD 7 Score 3 4  Anxiety Difficulty Somewhat difficult Not difficult at all     OBJECTIVE:   BP (!) 123/57 (BP Location: Right Arm, Patient Position: Sitting, Cuff Size: Normal)    Pulse 71    Temp 98 F (36.7 C) (Oral)    Resp 16    Ht 5\' 3"  (1.6 m)    Wt 162 lb 6.4 oz (73.7 kg)    BMI 28.77 kg/m   Gen: well appearing, in NAD Card: RRR Lungs: CTAB Ext: WWP, no edema Psych: pleasant, mood and affect appropriate  ASSESSMENT/PLAN:   Problem List Items Addressed This Visit       Other   Anxiety disorder    Doing well on current regimen, no changes made today.      Relevant Medications   FLUoxetine (PROZAC) 40 MG capsule   Recurrent major depressive disorder, in full remission ([place])    Doing well on current regimen, no changes made today.      Relevant Medications   FLUoxetine (PROZAC) 40 MG capsule   Other Visit Diagnoses     Screening for colon cancer    -   Primary   Relevant Orders   Ambulatory referral to Gastroenterology   Encounter for screening mammogram for malignant neoplasm of breast       Relevant Orders   MM DIGITAL SCREENING BILATERAL      F/u in 6 months for annual CPE, anxiety/depression.    Myles Gip, DO

## 2021-11-01 ENCOUNTER — Telehealth: Payer: Self-pay

## 2021-11-01 NOTE — Telephone Encounter (Signed)
CALLED PATIENT NO ANSWER LEFT VOICEMAIL FOR A CALL BACK °Letter sent °

## 2021-11-21 ENCOUNTER — Other Ambulatory Visit: Payer: Self-pay | Admitting: Family Medicine

## 2021-11-21 DIAGNOSIS — F3342 Major depressive disorder, recurrent, in full remission: Secondary | ICD-10-CM

## 2021-11-21 DIAGNOSIS — F411 Generalized anxiety disorder: Secondary | ICD-10-CM

## 2021-11-22 NOTE — Telephone Encounter (Signed)
Pharmacy has refills. ?Requested Prescriptions  ?Refused Prescriptions Disp Refills  ?? FLUoxetine (PROZAC) 40 MG capsule [Pharmacy Med Name: FLUOXETINE '40MG'$  CAPSULES] 30 capsule   ?  Sig: TAKE 1 CAPSULE BY MOUTH DAILY. OFFICE VISIT IS NEEDED FOR FURTHER REFILLS  ?  ? Psychiatry:  Antidepressants - SSRI Passed - 11/21/2021 11:34 AM  ?  ?  Passed - Completed PHQ-2 or PHQ-9 in the last 360 days  ?  ?  Passed - Valid encounter within last 6 months  ?  Recent Outpatient Visits   ?      ? 4 weeks ago Screening for colon cancer  ? [place], DO  ? 7 months ago Generalized anxiety disorder  ? [facility] Tally Joe T, FNP  ? 1 year ago Colon cancer screening  ? [place], [place]  ? 2 years ago Generalized anxiety disorder  ? [place], [place]  ? 3 years ago Depression, unspecified depression type  ? [place], [place]  ?  ?  ?Future Appointments   ?        ? In 4 months [name], [place], [facility]  ?  ? ?  ?  ?  ? ?

## 2022-04-12 ENCOUNTER — Ambulatory Visit (INDEPENDENT_AMBULATORY_CARE_PROVIDER_SITE_OTHER): Payer: BC Managed Care – PPO | Admitting: Family Medicine

## 2022-04-12 ENCOUNTER — Encounter: Payer: Self-pay | Admitting: Family Medicine

## 2022-04-12 ENCOUNTER — Other Ambulatory Visit: Payer: Self-pay | Admitting: *Deleted

## 2022-04-12 VITALS — BP 120/67 | HR 66 | Resp 16 | Ht 63.0 in | Wt 158.0 lb

## 2022-04-12 DIAGNOSIS — Z86018 Personal history of other benign neoplasm: Secondary | ICD-10-CM

## 2022-04-12 DIAGNOSIS — Z1211 Encounter for screening for malignant neoplasm of colon: Secondary | ICD-10-CM | POA: Diagnosis not present

## 2022-04-12 DIAGNOSIS — Z833 Family history of diabetes mellitus: Secondary | ICD-10-CM | POA: Diagnosis not present

## 2022-04-12 DIAGNOSIS — Z0000 Encounter for general adult medical examination without abnormal findings: Secondary | ICD-10-CM | POA: Diagnosis not present

## 2022-04-12 DIAGNOSIS — Z1231 Encounter for screening mammogram for malignant neoplasm of breast: Secondary | ICD-10-CM | POA: Insufficient documentation

## 2022-04-12 NOTE — Assessment & Plan Note (Signed)
Refer back to GYN for assistance; Due for Pap next year

## 2022-04-12 NOTE — Assessment & Plan Note (Signed)
UTD on dental °UTD on vision °Things to do to keep yourself healthy  °- Exercise at least 30-45 minutes a day, 3-4 days a week.  °- Eat a low-fat diet with lots of fruits and vegetables, up to 7-9 servings per day.  °- Seatbelts can save your life. Wear them always.  °- Smoke detectors on every level of your home, check batteries every year.  °- Eye Doctor - have an eye exam every 1-2 years  °- Safe sex - if you may be exposed to STDs, use a condom.  °- Alcohol -  If you drink, do it moderately, less than 2 drinks per day.  °- Health Care Power of Attorney. Choose someone to speak for you if you are not able.  °- Depression is common in our stressful world.If you're feeling down or losing interest in things you normally enjoy, please come in for a visit.  °- Violence - If anyone is threatening or hurting you, please call immediately. ° ° °

## 2022-04-12 NOTE — Assessment & Plan Note (Signed)
Check A1c Continue to recommend balanced, lower carb meals. Smaller meal size, adding snacks. Choosing water as drink of choice and increasing purposeful exercise.

## 2022-04-12 NOTE — Progress Notes (Signed)
Complete physical exam  I,[name],acting as a scribe for Allison Sprout, FNP.,have documented all relevant documentation on the behalf of Allison Sprout, FNP,as directed by  Allison Sprout, FNP while in the presence of Allison Sprout, FNP.   Patient: Allison Beard   DOB: [birth year]   51 y.o. Female  MRN: [id number] Visit Date: 04/12/2022  Today's healthcare provider: [name], FNP  Introduced to nurse practitioner role and practice setting.  All questions answered.  Discussed provider/patient relationship and expectations.   Chief Complaint  Patient presents with   Annual Exam   Subjective    [name] is a 51 y.o. female who presents today for a complete physical exam.  She reports consuming a general diet. The patient does not participate in regular exercise at present. She generally feels fairly well. She reports sleeping well. She does not have additional problems to discuss today.  HPI    Past Medical History:  Diagnosis Date   Anemia    Anxiety    Depression    Dizziness    Family history of ovarian cancer    9/21 cancer genetic testing letter sent   Menorrhagia    Uterine fibroid    Past Surgical History:  Procedure Laterality Date   BREAST BIOPSY Left 2015   neg-, neddle bx/clip and bx   HYSTEROSCOPY WITH D & C  2008   endometrial polyp   MM BREAST STEREO BIOPSY LEFT ([place] HX)  04/26/2013   ORIF ANKLE FRACTURE Right 1989   WISDOM TOOTH EXTRACTION     Social History   Socioeconomic History   Marital status: Married    Spouse name: Not on file   Number of children: 3   Years of education: 16   Highest education level: Not on file  Occupational History   Occupation: Pharmacist, hospital  Tobacco Use   Smoking status: Never   Smokeless tobacco: Never  Vaping Use   Vaping Use: Never used  Substance and Sexual Activity   Alcohol use: Yes    Alcohol/week: 0.0 standard drinks of alcohol    Comment: OCCASIONALLY   Drug use: No   Sexual  activity: Yes    Partners: Male    Birth control/protection: None  Other Topics Concern   Not on file  Social History Narrative   Not on file   Social Determinants of Health   Financial Resource Strain: Not on file  Food Insecurity: Not on file  Transportation Needs: Not on file  Physical Activity: Inactive (11/20/2017)   Exercise Vital Sign    Days of Exercise per Week: 0 days    Minutes of Exercise per Session: 0 min  Stress: No Stress Concern Present (11/20/2017)   Rew    Feeling of Stress : Only a little  Social Connections: Not on file  Intimate Partner Violence: Not on file   Family Status  Relation Name Status   Mother  Deceased at age 32   Father  Deceased   Sister  Alive   Programmer, systems  (Not Specified)   Mat Aunt  (Not Specified)   Mat Uncle two uncles (Not Specified)   Mat Aunt  (Not Specified)   Neg Hx  (Not Specified)   Family History  Problem Relation Age of Onset   Depression Mother    Diabetes Mother    Bipolar disorder Mother    Heart disease Mother  Hypertension Mother    COPD Mother    Psychiatric Illness Father    Bipolar disorder Sister    Lung cancer Maternal Aunt    Lung cancer Maternal Aunt    Heart disease Maternal Uncle    Ovarian cancer Maternal Aunt 70   Breast cancer Neg Hx    No Known Allergies  Patient Care Team: Allison Sprout, FNP as PCP - General (Family Medicine)   Medications: Outpatient Medications Prior to Visit  Medication Sig   FLUoxetine (PROZAC) 40 MG capsule Take 1 capsule (40 mg total) by mouth daily. Office visit is needed for further refills.   No facility-administered medications prior to visit.    Review of Systems  Constitutional:  Positive for diaphoresis.  All other systems reviewed and are negative.   Last CBC Lab Results  Component Value Date   WBC 5.0 04/24/2020   HGB 11.0 (L) 04/24/2020   HCT 34.4 04/24/2020   MCV 87  04/24/2020   MCH 27.7 04/24/2020   RDW 13.0 04/24/2020   PLT 243 74/08/8785   Last metabolic panel Lab Results  Component Value Date   GLUCOSE 94 12/15/2016   NA 139 12/15/2016   K 4.8 12/15/2016   CL 100 12/15/2016   CO2 26 12/15/2016   BUN 9 12/15/2016   CREATININE 0.72 12/15/2016   GFRNONAA 99 12/15/2016   CALCIUM 9.9 12/15/2016   PROT 6.9 12/15/2016   ALBUMIN 4.0 12/15/2016   LABGLOB 2.9 12/15/2016   AGRATIO 1.4 12/15/2016   BILITOT <0.2 12/15/2016   ALKPHOS 69 12/15/2016   AST 16 12/15/2016   ALT 11 12/15/2016   Last lipids Lab Results  Component Value Date   CHOL 198 04/24/2020   HDL 51 04/24/2020   LDLCALC 133 (H) 04/24/2020   TRIG 78 04/24/2020   CHOLHDL 4.9 (H) 07/24/2015   Last hemoglobin A1c Lab Results  Component Value Date   HGBA1C 6.0 (H) 04/24/2020   Last thyroid functions Lab Results  Component Value Date   TSH 2.330 12/13/2017     Objective     BP 120/67 (BP Location: Right Arm, Patient Position: Sitting, Cuff Size: Normal)   Pulse 66   Resp 16   Ht _0  (1.6 m)   Wt 158 lb (71.7 kg)   SpO2 98%   BMI 27.99 kg/m   BP Readings from Last 3 Encounters:  04/12/22 120/67  10/25/21 (!) 123/57  08/03/21 128/70   Wt Readings from Last 3 Encounters:  04/12/22 158 lb (71.7 kg)  10/25/21 162 lb 6.4 oz (73.7 kg)  06/02/21 158 lb (71.7 kg)   SpO2 Readings from Last 3 Encounters:  04/12/22 98%  08/03/21 98%  04/05/21 98%   Physical Exam Vitals and nursing note reviewed.  Constitutional:      General: She is awake. She is not in acute distress.    Appearance: Normal appearance. She is well-developed, well-groomed and overweight. She is not ill-appearing, toxic-appearing or diaphoretic.  HENT:     Head: Normocephalic and atraumatic.     Jaw: There is normal jaw occlusion. No trismus, tenderness, swelling or pain on movement.     Right Ear: Hearing, tympanic membrane, ear canal and external ear normal. There is no impacted cerumen.      Left Ear: Hearing, tympanic membrane, ear canal and external ear normal. There is no impacted cerumen.     Nose: Nose normal. No congestion or rhinorrhea.     Right Turbinates: Not enlarged, swollen or pale.  Left Turbinates: Not enlarged, swollen or pale.     Right Sinus: No maxillary sinus tenderness or frontal sinus tenderness.     Left Sinus: No maxillary sinus tenderness or frontal sinus tenderness.     Mouth/Throat:     Lips: Pink.     Mouth: Mucous membranes are moist. No injury.     Tongue: No lesions.     Pharynx: Oropharynx is clear. Uvula midline. No pharyngeal swelling, oropharyngeal exudate, posterior oropharyngeal erythema or uvula swelling.     Tonsils: No tonsillar exudate or tonsillar abscesses.  Eyes:     General: Lids are normal. Lids are everted, no foreign bodies appreciated. Vision grossly intact. Gaze aligned appropriately. No allergic shiner or visual field deficit.       Right eye: No discharge.        Left eye: No discharge.     Extraocular Movements: Extraocular movements intact.     Conjunctiva/sclera: Conjunctivae normal.     Right eye: Right conjunctiva is not injected. No exudate.    Left eye: Left conjunctiva is not injected. No exudate.    Pupils: Pupils are equal, round, and reactive to light.  Neck:     Thyroid: No thyroid mass, thyromegaly or thyroid tenderness.     Vascular: No carotid bruit.     Trachea: Trachea normal.  Cardiovascular:     Rate and Rhythm: Normal rate and regular rhythm.     Pulses: Normal pulses.          Carotid pulses are 2+ on the right side and 2+ on the left side.      Radial pulses are 2+ on the right side and 2+ on the left side.       Dorsalis pedis pulses are 2+ on the right side and 2+ on the left side.       Posterior tibial pulses are 2+ on the right side and 2+ on the left side.     Heart sounds: Normal heart sounds, S1 normal and S2 normal. No murmur heard.    No friction rub. No gallop.  Pulmonary:      Effort: Pulmonary effort is normal. No respiratory distress.     Breath sounds: Normal breath sounds and air entry. No stridor. No wheezing, rhonchi or rales.  Chest:     Chest wall: No tenderness.     Comments: Breasts: risk and benefit of breast self-exam was discussed, not examined, patient declines to have breast exam  Abdominal:     General: Abdomen is flat. Bowel sounds are normal. There is no distension.     Palpations: Abdomen is soft. There is no mass.     Tenderness: There is no abdominal tenderness. There is no right CVA tenderness, left CVA tenderness, guarding or rebound.     Hernia: No hernia is present.  Genitourinary:    Comments: Exam deferred; denies complaints Musculoskeletal:        General: No swelling, tenderness, deformity or signs of injury. Normal range of motion.     Cervical back: Full passive range of motion without pain, normal range of motion and neck supple. No edema, rigidity or tenderness. No muscular tenderness.     Right lower leg: No edema.     Left lower leg: No edema.  Lymphadenopathy:     Cervical: No cervical adenopathy.     Right cervical: No superficial, deep or posterior cervical adenopathy.    Left cervical: No superficial, deep or posterior cervical adenopathy.  Skin:  General: Skin is warm and dry.     Capillary Refill: Capillary refill takes less than 2 seconds.     Coloration: Skin is not jaundiced or pale.     Findings: No bruising, erythema, lesion or rash.  Neurological:     General: No focal deficit present.     Mental Status: She is alert and oriented to person, place, and time. Mental status is at baseline.     GCS: GCS eye subscore is 4. GCS verbal subscore is 5. GCS motor subscore is 6.     Sensory: Sensation is intact. No sensory deficit.     Motor: Motor function is intact. No weakness.     Coordination: Coordination is intact. Coordination normal.     Gait: Gait is intact. Gait normal.  Psychiatric:        Attention and  Perception: Attention and perception normal.        Mood and Affect: Mood and affect normal.        Speech: Speech normal.        Behavior: Behavior normal. Behavior is cooperative.        Thought Content: Thought content normal.        Cognition and Memory: Cognition and memory normal.        Judgment: Judgment normal.     Last depression screening scores    04/12/2022    3:11 PM 10/25/2021    3:43 PM 04/05/2021   10:56 AM  PHQ 2/9 Scores  PHQ - 2 Score 0 0 0  PHQ- 9 Score _0 Last fall risk screening    04/12/2022    3:11 PM  [place] in the past year? 0  Number falls in past yr: 0  Injury with Fall? 0  Risk for fall due to : No Fall Risks  Follow up Falls evaluation completed   Last Audit-C alcohol use screening    04/12/2022    3:11 PM  Alcohol Use Disorder Test (AUDIT)  1. How often do you have a drink containing alcohol? 2  2. How many drinks containing alcohol do you have on a typical day when you are drinking? 0  3. How often do you have six or more drinks on one occasion? 1  AUDIT-C Score 3  4. How often during the last year have you found that you were not able to stop drinking once you had started? 0  5. How often during the last year have you failed to do what was normally expected from you because of drinking? 0  6. How often during the last year have you needed a first drink in the morning to get yourself going after a heavy drinking session? 0  7. How often during the last year have you had a feeling of guilt of remorse after drinking? 0  8. How often during the last year have you been unable to remember what happened the night before because you had been drinking? 0  9. Have you or someone else been injured as a result of your drinking? 0  10. Has a relative or friend or a doctor or another health worker been concerned about your drinking or suggested you cut down? 0  Alcohol Use Disorder Identification Test Final Score (AUDIT) 3   A score of 3 or  more in women, and 4 or more in men indicates increased risk for alcohol abuse, EXCEPT if all of the points are from question  1   No results found for any visits on 04/12/22.  Assessment & Plan    Routine Health Maintenance and Physical Exam  Exercise Activities and Dietary recommendations  Goals   None     Immunization History  Administered Date(s) Administered   Tdap 07/26/2011    Health Maintenance  Topic Date Due   HIV Screening  Never done   Hepatitis C Screening  Never done   COLONOSCOPY (Pts 45-77yr Insurance coverage will need to be confirmed)  Never done   Zoster Vaccines- Shingrix (1 of 2) Never done   PAP SMEAR-Modifier  02/15/2021   MAMMOGRAM  06/03/2021   TETANUS/TDAP  07/25/2021   INFLUENZA VACCINE  04/12/2022   HPV VACCINES  Aged Out    Discussed health benefits of physical activity, and encouraged her to engage in regular exercise appropriate for her age and condition.  Problem List Items Addressed This Visit       Other   Annual physical exam - Primary    UTD on dental UTD on vision Things to do to keep yourself healthy  - Exercise at least 30-45 minutes a day, 3-4 days a week.  - Eat a low-fat diet with lots of fruits and vegetables, up to 7-9 servings per day.  - Seatbelts can save your life. Wear them always.  - Smoke detectors on every level of your home, check batteries every year.  - Eye Doctor - have an eye exam every 1-2 years  - Safe sex - if you may be exposed to STDs, use a condom.  - Alcohol -  If you drink, do it moderately, less than 2 drinks per day.  - [place] Choose someone to speak for you if you are not able.  - Depression is common in our stressful world.If you're feeling down or losing interest in things you normally enjoy, please come in for a visit.  - Violence - If anyone is threatening or hurting you, please call immediately.        Relevant Orders   Comprehensive Metabolic Panel (CMET)   CBC  with Differential/Platelet   TSH + free T4   Lipid panel   Colon cancer screening    Due for colonoscopy; previously ordered and cancelled d/t COVID and had Cologuard kit and did not complete      Relevant Orders   Ambulatory referral to Gastroenterology   Encounter for screening mammogram for malignant neoplasm of breast    Due for screening for mammogram, denies breast concerns, provided with phone number to call and schedule appointment for mammogram. Encouraged to repeat breast cancer screening every 1-2 years.       Relevant Orders   MM 3D SCREEN BREAST BILATERAL   Family history of diabetes mellitus in mother    Check A1c Continue to recommend balanced, lower carb meals. Smaller meal size, adding snacks. Choosing water as drink of choice and increasing purposeful exercise.       Relevant Orders   Hemoglobin A1c   History of uterine fibroid    Refer back to GYN for assistance; Due for Pap next year      Relevant Orders   Ambulatory referral to Gynecology     Return in about 6 months (around 10/13/2022) for chonic disease management.     IVonna Kotyk FNP, have reviewed all documentation for this visit. The documentation on 04/12/22 for the exam, diagnosis, procedures, and orders are all accurate and complete.  Allison Beard, [place] [phone] (phone) [phone] (fax)  [place]

## 2022-04-12 NOTE — Assessment & Plan Note (Signed)
Due for screening for mammogram, denies breast concerns, provided with phone number to call and schedule appointment for mammogram. Encouraged to repeat breast cancer screening every 1-2 years.  

## 2022-04-12 NOTE — Patient Instructions (Signed)
Please call and schedule your mammogram:  [facility]  [place], [place],    [address] [address]irections Main: [phone]  Sunday:Closed Monday:7:20 AM - 5:00 PM Tuesday:7:20 AM - 5:00 PM Wednesday:7:20 AM - 5:00 PM Thursday:7:20 AM - 5:00 PM Friday:7:20 AM - 4:30 PM Saturday:Closed  The CDC recommends two doses of Shingrix (the new shingles vaccine) separated by 2 to 6 months for adults age 14 years and older. I recommend checking with your insurance plan regarding coverage for this vaccine.

## 2022-04-12 NOTE — Assessment & Plan Note (Signed)
Due for colonoscopy; previously ordered and cancelled d/t COVID and had Cologuard kit and did not complete

## 2022-04-13 ENCOUNTER — Other Ambulatory Visit: Payer: Self-pay | Admitting: Family Medicine

## 2022-04-13 DIAGNOSIS — R7303 Prediabetes: Secondary | ICD-10-CM

## 2022-04-13 DIAGNOSIS — D508 Other iron deficiency anemias: Secondary | ICD-10-CM

## 2022-04-13 LAB — CBC WITH DIFFERENTIAL/PLATELET
Basophils Absolute: 0 10*3/uL (ref 0.0–0.2)
Basos: 1 %
EOS (ABSOLUTE): 0.1 10*3/uL (ref 0.0–0.4)
Eos: 1 %
Hematocrit: 28.9 % — ABNORMAL LOW (ref 34.0–46.6)
Hemoglobin: 8.6 g/dL — ABNORMAL LOW (ref 11.1–15.9)
Immature Grans (Abs): 0 10*3/uL (ref 0.0–0.1)
Immature Granulocytes: 0 %
Lymphocytes Absolute: 1.9 10*3/uL (ref 0.7–3.1)
Lymphs: 40 %
MCH: 23 pg — ABNORMAL LOW (ref 26.6–33.0)
MCHC: 29.8 g/dL — ABNORMAL LOW (ref 31.5–35.7)
MCV: 77 fL — ABNORMAL LOW (ref 79–97)
Monocytes Absolute: 0.5 10*3/uL (ref 0.1–0.9)
Monocytes: 11 %
Neutrophils Absolute: 2.3 10*3/uL (ref 1.4–7.0)
Neutrophils: 47 %
Platelets: 273 10*3/uL (ref 150–450)
RBC: 3.74 x10E6/uL — ABNORMAL LOW (ref 3.77–5.28)
RDW: 15.9 % — ABNORMAL HIGH (ref 11.7–15.4)
WBC: 4.8 10*3/uL (ref 3.4–10.8)

## 2022-04-13 LAB — COMPREHENSIVE METABOLIC PANEL
ALT: 7 IU/L (ref 0–32)
AST: 14 IU/L (ref 0–40)
Albumin/Globulin Ratio: 1.8 (ref 1.2–2.2)
Albumin: 4.4 g/dL (ref 3.8–4.9)
Alkaline Phosphatase: 74 IU/L (ref 44–121)
BUN/Creatinine Ratio: 21 (ref 9–23)
BUN: 17 mg/dL (ref 6–24)
Bilirubin Total: <0.2 mg/dL (ref 0.0–1.2)
CO2: 24 mmol/L (ref 20–29)
Calcium: 9.5 mg/dL (ref 8.7–10.2)
Chloride: 105 mmol/L (ref 96–106)
Creatinine, Ser: 0.82 mg/dL (ref 0.57–1.00)
Globulin, Total: 2.5 g/dL (ref 1.5–4.5)
Glucose: 85 mg/dL (ref 70–99)
Potassium: 4.7 mmol/L (ref 3.5–5.2)
Sodium: 141 mmol/L (ref 134–144)
Total Protein: 6.9 g/dL (ref 6.0–8.5)
eGFR: 85 mL/min/{1.73_m2} (ref >59)

## 2022-04-13 LAB — LIPID PANEL
Chol/HDL Ratio: 4 ratio (ref 0.0–4.4)
Cholesterol, Total: 207 mg/dL — ABNORMAL HIGH (ref 100–199)
HDL: 52 mg/dL (ref >39)
LDL Chol Calc (NIH): 128 mg/dL — ABNORMAL HIGH (ref 0–99)
Triglycerides: 155 mg/dL — ABNORMAL HIGH (ref 0–149)
VLDL Cholesterol Cal: 27 mg/dL (ref 5–40)

## 2022-04-13 LAB — HEMOGLOBIN A1C
Est. average glucose Bld gHb Est-mCnc: 126 mg/dL
Hgb A1c MFr Bld: 6 % — ABNORMAL HIGH (ref 4.8–5.6)

## 2022-04-13 LAB — TSH+FREE T4
Free T4: 0.99 ng/dL (ref 0.82–1.77)
TSH: 2.64 u[IU]/mL (ref 0.450–4.500)

## 2022-04-13 MED ORDER — METFORMIN HCL ER 750 MG PO TB24: 750.0000 mg | ORAL_TABLET | Freq: Every day | ORAL | 1 refills | Status: DC

## 2022-04-13 NOTE — Progress Notes (Signed)
Substantial decrease in your iron stores, hemoglobin and hematocrit. Recommend follow up with hematology to assist with treatment.  Cholesterol is increased. LDL is elevated; goal <100. The 10-year ASCVD risk score (Arnett DK, et al., 2019) is: 1.6%   Values used to calculate the score:     Age: 51 years     Sex: Female     Is Non-Hispanic African American: No     Diabetic: No     Tobacco smoker: No     Systolic Blood Pressure: 759 mmHg     Is BP treated: No     HDL Cholesterol: 52 mg/dL     Total Cholesterol: 207 mg/dL Heart attack and stroke risk is 2% estimated within the next 10 years which is low.   Blood sugar average continues to show pre-diabetes. Can start 500 or 750 mg XR metformin to assist. Continue to recommend balanced, lower carb meals. Smaller meal size, adding snacks. Choosing water as drink of choice and increasing purposeful exercise.  All other labs normal and stable.  Please let us know if you have any questions.  Thank you, [name], [address] [place], [place] [address] [phone] (phone) [phone] (fax) [place]

## 2022-04-15 ENCOUNTER — Ambulatory Visit (INDEPENDENT_AMBULATORY_CARE_PROVIDER_SITE_OTHER): Payer: BC Managed Care – PPO | Admitting: Family Medicine

## 2022-04-15 DIAGNOSIS — Z23 Encounter for immunization: Secondary | ICD-10-CM

## 2022-04-15 NOTE — Progress Notes (Signed)
Patient provided with VIS for Shingrix; no complications s/p vaccination. Consent received. No assessment from NP.  Allison Beard, [address] [place], [place] [address] [phone] (phone) [phone] (fax) [place]

## 2022-04-26 ENCOUNTER — Inpatient Hospital Stay: Payer: BC Managed Care – PPO | Attending: Oncology | Admitting: Oncology

## 2022-04-26 ENCOUNTER — Inpatient Hospital Stay: Payer: BC Managed Care – PPO

## 2022-04-26 ENCOUNTER — Encounter: Payer: Self-pay | Admitting: Oncology

## 2022-04-26 VITALS — BP 116/71 | HR 71 | Temp 97.0°F | Resp 18 | Ht 63.0 in | Wt 157.6 lb

## 2022-04-26 DIAGNOSIS — D509 Iron deficiency anemia, unspecified: Secondary | ICD-10-CM

## 2022-04-26 DIAGNOSIS — D500 Iron deficiency anemia secondary to blood loss (chronic): Secondary | ICD-10-CM

## 2022-04-26 LAB — RETIC PANEL
Immature Retic Fract: 10.7 % (ref 2.3–15.9)
RBC.: 3.9 MIL/uL (ref 3.87–5.11)
Retic Count, Absolute: 43.3 10*3/uL (ref 19.0–186.0)
Retic Ct Pct: 1.1 % (ref 0.4–3.1)
Reticulocyte Hemoglobin: 25.4 pg — ABNORMAL LOW (ref >27.9)

## 2022-04-26 LAB — CBC WITH DIFFERENTIAL/PLATELET
Abs Immature Granulocytes: 0.02 10*3/uL (ref 0.00–0.07)
Basophils Absolute: 0 10*3/uL (ref 0.0–0.1)
Basophils Relative: 1 %
Eosinophils Absolute: 0.1 10*3/uL (ref 0.0–0.5)
Eosinophils Relative: 1 %
HCT: 31 % — ABNORMAL LOW (ref 36.0–46.0)
Hemoglobin: 9.1 g/dL — ABNORMAL LOW (ref 12.0–15.0)
Immature Granulocytes: 0 %
Lymphocytes Relative: 33 %
Lymphs Abs: 2.1 10*3/uL (ref 0.7–4.0)
MCH: 23.2 pg — ABNORMAL LOW (ref 26.0–34.0)
MCHC: 29.4 g/dL — ABNORMAL LOW (ref 30.0–36.0)
MCV: 79.1 fL — ABNORMAL LOW (ref 80.0–100.0)
Monocytes Absolute: 0.4 10*3/uL (ref 0.1–1.0)
Monocytes Relative: 6 %
Neutro Abs: 3.7 10*3/uL (ref 1.7–7.7)
Neutrophils Relative %: 59 %
Platelets: 295 10*3/uL (ref 150–400)
RBC: 3.92 MIL/uL (ref 3.87–5.11)
RDW: 16.4 % — ABNORMAL HIGH (ref 11.5–15.5)
WBC: 6.3 10*3/uL (ref 4.0–10.5)
nRBC: 0 % (ref 0.0–0.2)

## 2022-04-26 LAB — IRON AND TIBC
Iron: 29 ug/dL (ref 28–170)
Saturation Ratios: 6 % — ABNORMAL LOW (ref 10.4–31.8)
TIBC: 469 ug/dL — ABNORMAL HIGH (ref 250–450)
UIBC: 440 ug/dL

## 2022-04-26 LAB — FERRITIN: Ferritin: 3 ng/mL — ABNORMAL LOW (ref 11–307)

## 2022-04-26 NOTE — Progress Notes (Signed)
Hematology/Oncology Consult note [phone] [phone]      Patient Care Team: [name], FNP as PCP - General (Family Medicine)   REFERRING PROVIDER: [name], FNP  CHIEF COMPLAINTS/REASON FOR VISIT:  Anemia  ASSESSMENT & PLAN:   Iron deficiency anemia Microcytic anemia, I suspect that patient has underlying iron deficiency.  Check CBC, iron, TIBC ferritin, reticulocyte panel. We discussed about possibility of iron deficiency anemia and discussed about the treatment options. I discussed about option of oral iron supplementation and repeat blood work for evaluation of treatment response.  If no significant improvement, then proceed with IV Venofer treatments. Alternative option of proceed with IV Venofer treatments. I discussed about the potential risks including but not limited to allergic reactions/infusion reactions including anaphylactic reactions, phlebitis, high blood pressure, wheezing, SOB, skin rash, weight gain, leg swelling, headache, nausea and fatigue, etc. Patient is interested in IV Venofer treatments if needed.  Today's labs reviewed and discussed with patient.  Consistent with severe iron deficiency anemia.  I recommend IV venofer weekly x 5  Etiology of iron deficiency unclear, secondary to blood loss due to menstrual period versus GI tract.  She is overdue for colonoscopy.  She would like me to send referral to gastroenterology.  Refer to [facility] Dr. Marius Ditch.  Orders Placed This Encounter  Procedures   CBC with Differential/Platelet    Standing Status:   Future    Number of Occurrences:   1    Standing Expiration Date:   04/27/2023   Retic Panel    Standing Status:   Future    Number of Occurrences:   1    Standing Expiration Date:   04/27/2023   Ferritin    Standing Status:   Future    Number of Occurrences:   1    Standing Expiration Date:   10/27/2022   Iron and TIBC    Standing Status:   Future    Number of  Occurrences:   1    Standing Expiration Date:   04/27/2023   Ambulatory referral to Gastroenterology    Referral Priority:   Routine    Referral Type:   Consultation    Referral Reason:   Specialty Services Required    Referred to Provider:   Lin Landsman, MD    Number of Visits Requested:   1   Follow-up in 3 months All questions were answered. The patient knows to call the clinic with any problems, questions or concerns.  Earlie Server, MD, PhD [facility] Hematology Oncology 04/26/2022     HISTORY OF PRESENTING ILLNESS:  Allison Beard is a  51 y.o.  female with PMH listed below who was referred to me for anemia Reviewed patient's recent labs that was done.  She was found to have abnormal CBC on 04/12/2022, with a hemoglobin of 8.6, MCV 77, normal white count and normal platelet counts. Reviewed patient's previous labs ordered by primary care physician's office, anemia is chronic onset , duration is since August 2021.  At that time hemoglobin was 11. Patient reports feeling tired and fatigued. She denies recent chest pain on exertion, shortness of breath on minimal exertion, pre-syncopal episodes, or palpitations. She had not noticed any recent bleeding such as epistaxis, hematuria or hematochezia.  Her menstrual period is irregular, she has one period every couple of months.  Sometimes flow is heavy. She denies over the counter NSAID ingestion. She is not  on antiplatelets agents. Patient is overdue for  colonoscopy screening. She denies any pica and eats a variety of diet.  Patient is a Pharmacist, hospital and teaches second grade.  MEDICAL HISTORY:  Past Medical History:  Diagnosis Date   Anemia    Anxiety    Depression    Dizziness    Family history of ovarian cancer    9/21 cancer genetic testing letter sent   Menorrhagia    Uterine fibroid     SURGICAL HISTORY: Past Surgical History:  Procedure Laterality Date   BREAST BIOPSY Left 2015   neg-, neddle bx/clip and bx    HYSTEROSCOPY WITH D & C  2008   endometrial polyp   MM BREAST STEREO BIOPSY LEFT ([place] [facility]X)  04/26/2013   ORIF ANKLE FRACTURE Right 1989   WISDOM TOOTH EXTRACTION      SOCIAL HISTORY: Social History   Socioeconomic History   Marital status: Married    Spouse name: Not on file   Number of children: 3   Years of education: 16   Highest education level: Not on file  Occupational History   Occupation: Pharmacist, hospital  Tobacco Use   Smoking status: Never   Smokeless tobacco: Never  Vaping Use   Vaping Use: Never used  Substance and Sexual Activity   Alcohol use: Yes    Alcohol/week: 0.0 standard drinks of alcohol    Comment: OCCASIONALLY   Drug use: No   Sexual activity: Yes    Partners: Male    Birth control/protection: None  Other Topics Concern   Not on file  Social History Narrative   Not on file   Social Determinants of Health   Financial Resource Strain: Not on file  Food Insecurity: Not on file  Transportation Needs: Not on file  Physical Activity: Inactive (11/20/2017)   Exercise Vital Sign    Days of Exercise per Week: 0 days    Minutes of Exercise per Session: 0 min  Stress: No Stress Concern Present (11/20/2017)   [place]    Feeling of Stress : Only a little  Social Connections: Not on file  Intimate Partner Violence: Not on file    FAMILY HISTORY: Family History  Problem Relation Age of Onset   Depression Mother    Diabetes Mother    Bipolar disorder Mother    Heart disease Mother    Hypertension Mother    COPD Mother    Psychiatric Illness Father    Bipolar disorder Sister    Lung cancer Maternal Aunt    Lung cancer Maternal Aunt    Heart disease Maternal Uncle    Ovarian cancer Maternal Aunt 70   Breast cancer Neg Hx     ALLERGIES:  has No Known Allergies.  MEDICATIONS:  Current Outpatient Medications  Medication Sig Dispense Refill   FLUoxetine (PROZAC) 40 MG capsule Take 1  capsule (40 mg total) by mouth daily. Office visit is needed for further refills. 90 capsule 3   metFORMIN (GLUCOPHAGE-XR) 750 MG 24 hr tablet Take 1 tablet (750 mg total) by mouth daily with breakfast. 90 tablet 1   No current facility-administered medications for this visit.    Review of Systems  Constitutional:  Positive for fatigue. Negative for appetite change, chills and fever.  HENT:   Negative for hearing loss and voice change.   Eyes:  Negative for eye problems.  Respiratory:  Negative for chest tightness and cough.   Cardiovascular:  Negative for chest pain.  Gastrointestinal:  Negative for  abdominal distention, abdominal pain and blood in stool.  Endocrine: Negative for hot flashes.  Genitourinary:  Negative for difficulty urinating and frequency.   Musculoskeletal:  Negative for arthralgias.  Skin:  Negative for itching and rash.  Neurological:  Negative for extremity weakness.  Hematological:  Negative for adenopathy.  Psychiatric/Behavioral:  Negative for confusion.     PHYSICAL EXAMINATION:  Vitals:   04/26/22 1109  BP: 116/71  Pulse: 71  Resp: 18  Temp: (!) 97 F (36.1 C)   Filed Weights   04/26/22 1109  Weight: 157 lb 9.6 oz (71.5 kg)    Physical Exam Constitutional:      General: She is not in acute distress. HENT:     Head: Normocephalic and atraumatic.  Eyes:     General: No scleral icterus. Cardiovascular:     Rate and Rhythm: Normal rate and regular rhythm.     Heart sounds: Normal heart sounds.  Pulmonary:     Effort: Pulmonary effort is normal. No respiratory distress.     Breath sounds: No wheezing.  Abdominal:     General: Bowel sounds are normal. There is no distension.     Palpations: Abdomen is soft.  Musculoskeletal:        General: No deformity. Normal range of motion.     Cervical back: Normal range of motion and neck supple.  Skin:    General: Skin is warm and dry.     Findings: No erythema or rash.  Neurological:     Mental  Status: She is alert and oriented to person, place, and time. Mental status is at baseline.     Cranial Nerves: No cranial nerve deficit.     Coordination: Coordination normal.  Psychiatric:        Mood and Affect: Mood normal.      LABORATORY DATA:  I have reviewed the data as listed    Latest Ref Rng & Units 04/26/2022   11:47 AM 04/12/2022    3:48 PM 04/24/2020    9:27 AM  CBC  WBC 4.0 - 10.5 K/uL 6.3  4.8  5.0   Hemoglobin 12.0 - 15.0 g/dL 9.1  8.6  11.0   Hematocrit 36.0 - 46.0 % 31.0  28.9  34.4   Platelets 150 - 400 K/uL 295  273  243       Latest Ref Rng & Units 04/12/2022    3:48 PM 12/15/2016    9:36 AM 07/24/2015    9:10 AM  CMP  Glucose 70 - 99 mg/dL 85  94  103   BUN 6 - 24 mg/dL '17  9  13   '$ Creatinine 0.57 - 1.00 mg/dL 0.82  0.72  0.67   Sodium 134 - 144 mmol/L 141  139  141   Potassium 3.5 - 5.2 mmol/L 4.7  4.8  5.1   Chloride 96 - 106 mmol/L 105  100  102   CO2 20 - 29 mmol/L '24  26  24   '$ Calcium 8.7 - 10.2 mg/dL 9.5  9.9  9.4   Total Protein 6.0 - 8.5 g/dL 6.9  6.9  6.8   Total Bilirubin 0.0 - 1.2 mg/dL <0.2  <0.2  0.4   Alkaline Phos 44 - 121 IU/L 74  69  73   AST 0 - 40 IU/L '14  16  14   '$ ALT 0 - 32 IU/L '7  11  11       '$ Component Value Date/Time   IRON 29  04/26/2022 1147   TIBC 469 (H) 04/26/2022 1147   FERRITIN 3 (L) 04/26/2022 1147   IRONPCTSAT 6 (L) 04/26/2022 1147     RADIOGRAPHIC STUDIES: I have personally reviewed the radiological images as listed and agreed with the findings in the report. No results found.

## 2022-04-26 NOTE — Addendum Note (Signed)
Addended by: Earlie Server on: 04/26/2022 08:08 PM   Modules accepted: Orders

## 2022-04-26 NOTE — Assessment & Plan Note (Addendum)
Microcytic anemia, I suspect that patient has underlying iron deficiency.  Check CBC, iron, TIBC ferritin, reticulocyte panel. We discussed about possibility of iron deficiency anemia and discussed about the treatment options. I discussed about option of oral iron supplementation and repeat blood work for evaluation of treatment response.  If no significant improvement, then proceed with IV Venofer treatments. Alternative option of proceed with IV Venofer treatments. I discussed about the potential risks including but not limited to allergic reactions/infusion reactions including anaphylactic reactions, phlebitis, high blood pressure, wheezing, SOB, skin rash, weight gain, leg swelling, headache, nausea and fatigue, etc. Patient is interested in IV Venofer treatments if needed. Patient denies any chance of pregnancy and declines pregnancy testing prior to IV iron treatments.  Today's labs reviewed and discussed with patient.  Consistent with severe iron deficiency anemia.  I recommend IV venofer weekly x 5  Etiology of iron deficiency unclear, secondary to blood loss due to menstrual period versus GI tract.  She is overdue for colonoscopy.  She would like me to send referral to gastroenterology.  Refer to [facility] Dr. Marius Ditch.

## 2022-04-27 ENCOUNTER — Telehealth: Payer: Self-pay

## 2022-04-27 NOTE — Telephone Encounter (Signed)
-----   Message from Earlie Server, MD sent at 04/26/2022  8:07 PM EDT ----- Please let patient know that her blood work results are consistent with severe iron deficiency anemia. She is interested in IV Venofer treatments.  I recommend Venofer weekly x5 please arrange. Follow-up labs in 3 months prior to MD +/- Venofer.

## 2022-04-27 NOTE — Telephone Encounter (Signed)
Called and informed patient of lab results and Dr. Collie Siad recommendations. Patient verbalized understanding.    [place], please schedule patient for: IV venofer weekly x 5 Lab in  months MD/venofer 2 days after.  Please notify patient of appt. Thanks

## 2022-05-05 ENCOUNTER — Inpatient Hospital Stay: Payer: BC Managed Care – PPO

## 2022-05-11 ENCOUNTER — Other Ambulatory Visit: Payer: Self-pay | Admitting: Oncology

## 2022-05-12 ENCOUNTER — Inpatient Hospital Stay: Payer: BC Managed Care – PPO

## 2022-05-12 VITALS — BP 111/57 | HR 69 | Temp 97.5°F | Resp 18

## 2022-05-12 DIAGNOSIS — D509 Iron deficiency anemia, unspecified: Secondary | ICD-10-CM | POA: Diagnosis not present

## 2022-05-12 DIAGNOSIS — D500 Iron deficiency anemia secondary to blood loss (chronic): Secondary | ICD-10-CM

## 2022-05-12 MED ORDER — SODIUM CHLORIDE 0.9 % IV SOLN
Freq: Once | INTRAVENOUS | Status: AC
  Filled 2022-05-12: qty 250

## 2022-05-12 MED ORDER — SODIUM CHLORIDE 0.9 % IV SOLN
200.0000 mg | Freq: Once | INTRAVENOUS | Status: AC
  Administered 2022-05-12: 200 mg via INTRAVENOUS
  Filled 2022-05-12: qty 200

## 2022-05-19 ENCOUNTER — Inpatient Hospital Stay: Payer: BC Managed Care – PPO | Attending: Oncology

## 2022-05-19 ENCOUNTER — Inpatient Hospital Stay: Payer: BC Managed Care – PPO

## 2022-05-19 VITALS — BP 106/63 | HR 70 | Temp 98.0°F | Resp 18

## 2022-05-19 DIAGNOSIS — D500 Iron deficiency anemia secondary to blood loss (chronic): Secondary | ICD-10-CM

## 2022-05-19 DIAGNOSIS — D509 Iron deficiency anemia, unspecified: Secondary | ICD-10-CM | POA: Diagnosis present

## 2022-05-19 MED ORDER — SODIUM CHLORIDE 0.9 % IV SOLN
Freq: Once | INTRAVENOUS | Status: AC
  Filled 2022-05-19: qty 250

## 2022-05-19 MED ORDER — SODIUM CHLORIDE 0.9 % IV SOLN
200.0000 mg | Freq: Once | INTRAVENOUS | Status: AC
  Administered 2022-05-19: 200 mg via INTRAVENOUS
  Filled 2022-05-19: qty 200

## 2022-05-26 ENCOUNTER — Inpatient Hospital Stay: Payer: BC Managed Care – PPO

## 2022-05-26 VITALS — BP 120/70 | HR 64 | Temp 97.4°F | Resp 18

## 2022-05-26 DIAGNOSIS — D500 Iron deficiency anemia secondary to blood loss (chronic): Secondary | ICD-10-CM

## 2022-05-26 DIAGNOSIS — D509 Iron deficiency anemia, unspecified: Secondary | ICD-10-CM | POA: Diagnosis not present

## 2022-05-26 MED ORDER — SODIUM CHLORIDE 0.9 % IV SOLN
200.0000 mg | Freq: Once | INTRAVENOUS | Status: AC
  Administered 2022-05-26: 200 mg via INTRAVENOUS
  Filled 2022-05-26: qty 200

## 2022-05-26 MED ORDER — SODIUM CHLORIDE 0.9 % IV SOLN
Freq: Once | INTRAVENOUS | Status: AC
  Filled 2022-05-26: qty 250

## 2022-06-02 ENCOUNTER — Inpatient Hospital Stay: Payer: BC Managed Care – PPO

## 2022-06-02 VITALS — BP 108/60 | HR 63 | Temp 95.6°F | Resp 20

## 2022-06-02 DIAGNOSIS — D509 Iron deficiency anemia, unspecified: Secondary | ICD-10-CM | POA: Diagnosis not present

## 2022-06-02 DIAGNOSIS — D500 Iron deficiency anemia secondary to blood loss (chronic): Secondary | ICD-10-CM

## 2022-06-02 MED ORDER — SODIUM CHLORIDE 0.9 % IV SOLN
200.0000 mg | Freq: Once | INTRAVENOUS | Status: AC
  Administered 2022-06-02: 200 mg via INTRAVENOUS
  Filled 2022-06-02: qty 200

## 2022-06-02 MED ORDER — SODIUM CHLORIDE 0.9 % IV SOLN
Freq: Once | INTRAVENOUS | Status: AC
  Filled 2022-06-02: qty 250

## 2022-06-09 ENCOUNTER — Inpatient Hospital Stay: Payer: BC Managed Care – PPO

## 2022-06-09 VITALS — BP 117/72 | HR 67 | Temp 95.0°F | Resp 18

## 2022-06-09 DIAGNOSIS — D509 Iron deficiency anemia, unspecified: Secondary | ICD-10-CM | POA: Diagnosis not present

## 2022-06-09 DIAGNOSIS — D500 Iron deficiency anemia secondary to blood loss (chronic): Secondary | ICD-10-CM

## 2022-06-09 MED ORDER — SODIUM CHLORIDE 0.9 % IV SOLN
200.0000 mg | Freq: Once | INTRAVENOUS | Status: AC
  Administered 2022-06-09: 200 mg via INTRAVENOUS
  Filled 2022-06-09: qty 200

## 2022-06-09 MED ORDER — SODIUM CHLORIDE 0.9 % IV SOLN
Freq: Once | INTRAVENOUS | Status: AC
  Filled 2022-06-09: qty 250

## 2022-06-14 ENCOUNTER — Ambulatory Visit
Admission: RE | Admit: 2022-06-14 | Discharge: 2022-06-14 | Disposition: A | Discharge status: Home / Self Care | Payer: BC Managed Care – PPO | Source: Ambulatory Visit | Attending: Family Medicine | Admitting: Family Medicine

## 2022-06-14 ENCOUNTER — Encounter: Payer: Self-pay | Admitting: Radiology

## 2022-06-14 DIAGNOSIS — Z1231 Encounter for screening mammogram for malignant neoplasm of breast: Secondary | ICD-10-CM | POA: Insufficient documentation

## 2022-06-15 NOTE — Progress Notes (Signed)
Hi Patsy  Normal mammogram; repeat in 1 year.  Please let us know if you have any questions.  Thank you,  Tally Joe, FNP

## 2022-06-21 ENCOUNTER — Ambulatory Visit: Payer: BC Managed Care – PPO | Admitting: Family Medicine

## 2022-06-24 ENCOUNTER — Ambulatory Visit (INDEPENDENT_AMBULATORY_CARE_PROVIDER_SITE_OTHER): Payer: BC Managed Care – PPO | Admitting: Family Medicine

## 2022-06-24 DIAGNOSIS — Z23 Encounter for immunization: Secondary | ICD-10-CM

## 2022-06-24 NOTE — Progress Notes (Signed)
Patient is here for shingrix vaccine.

## 2022-07-28 ENCOUNTER — Inpatient Hospital Stay: Payer: BC Managed Care – PPO | Attending: Oncology

## 2022-07-28 ENCOUNTER — Other Ambulatory Visit: Payer: BC Managed Care – PPO

## 2022-07-28 DIAGNOSIS — D500 Iron deficiency anemia secondary to blood loss (chronic): Secondary | ICD-10-CM

## 2022-07-28 DIAGNOSIS — D509 Iron deficiency anemia, unspecified: Secondary | ICD-10-CM | POA: Diagnosis not present

## 2022-07-28 LAB — CBC WITH DIFFERENTIAL/PLATELET
Abs Immature Granulocytes: 0.06 10*3/uL (ref 0.00–0.07)
Basophils Absolute: 0 10*3/uL (ref 0.0–0.1)
Basophils Relative: 1 %
Eosinophils Absolute: 0.1 10*3/uL (ref 0.0–0.5)
Eosinophils Relative: 1 %
HCT: 38.9 % (ref 36.0–46.0)
Hemoglobin: 12.3 g/dL (ref 12.0–15.0)
Immature Granulocytes: 1 %
Lymphocytes Relative: 34 %
Lymphs Abs: 2 10*3/uL (ref 0.7–4.0)
MCH: 28.1 pg (ref 26.0–34.0)
MCHC: 31.6 g/dL (ref 30.0–36.0)
MCV: 89 fL (ref 80.0–100.0)
Monocytes Absolute: 0.4 10*3/uL (ref 0.1–1.0)
Monocytes Relative: 7 %
Neutro Abs: 3.4 10*3/uL (ref 1.7–7.7)
Neutrophils Relative %: 56 %
Platelets: 242 10*3/uL (ref 150–400)
RBC: 4.37 MIL/uL (ref 3.87–5.11)
RDW: 17.5 % — ABNORMAL HIGH (ref 11.5–15.5)
WBC: 6 10*3/uL (ref 4.0–10.5)
nRBC: 0 % (ref 0.0–0.2)

## 2022-07-28 LAB — RETIC PANEL
Immature Retic Fract: 6.2 % (ref 2.3–15.9)
RBC.: 4.36 MIL/uL (ref 3.87–5.11)
Retic Count, Absolute: 53.2 10*3/uL (ref 19.0–186.0)
Retic Ct Pct: 1.2 % (ref 0.4–3.1)
Reticulocyte Hemoglobin: 33.7 pg (ref >27.9)

## 2022-07-28 LAB — FERRITIN: Ferritin: 89 ng/mL (ref 11–307)

## 2022-07-28 LAB — IRON AND TIBC
Iron: 62 ug/dL (ref 28–170)
Saturation Ratios: 18 % (ref 10.4–31.8)
TIBC: 346 ug/dL (ref 250–450)
UIBC: 284 ug/dL

## 2022-08-01 ENCOUNTER — Ambulatory Visit: Payer: BC Managed Care – PPO

## 2022-08-01 ENCOUNTER — Ambulatory Visit: Payer: BC Managed Care – PPO | Admitting: Oncology

## 2022-08-01 MED FILL — Iron Sucrose Inj 20 MG/ML (Fe Equiv): INTRAVENOUS | Qty: 10 | Status: AC

## 2022-08-02 ENCOUNTER — Inpatient Hospital Stay: Payer: BC Managed Care – PPO

## 2022-08-02 ENCOUNTER — Encounter: Payer: Self-pay | Admitting: Oncology

## 2022-08-02 ENCOUNTER — Inpatient Hospital Stay (HOSPITAL_BASED_OUTPATIENT_CLINIC_OR_DEPARTMENT_OTHER): Payer: BC Managed Care – PPO | Admitting: Oncology

## 2022-08-02 VITALS — BP 110/54 | HR 80 | Temp 97.4°F | Wt 160.1 lb

## 2022-08-02 DIAGNOSIS — Z809 Family history of malignant neoplasm, unspecified: Secondary | ICD-10-CM | POA: Diagnosis not present

## 2022-08-02 DIAGNOSIS — D509 Iron deficiency anemia, unspecified: Secondary | ICD-10-CM | POA: Diagnosis not present

## 2022-08-02 DIAGNOSIS — D500 Iron deficiency anemia secondary to blood loss (chronic): Secondary | ICD-10-CM | POA: Diagnosis not present

## 2022-08-03 ENCOUNTER — Encounter: Payer: Self-pay | Admitting: Oncology

## 2022-08-03 DIAGNOSIS — Z809 Family history of malignant neoplasm, unspecified: Secondary | ICD-10-CM | POA: Insufficient documentation

## 2022-08-03 NOTE — Assessment & Plan Note (Signed)
Patient declined genetic testing. 

## 2022-08-03 NOTE — Assessment & Plan Note (Addendum)
Labs are reviewed and discussed with patient. Both hemoglobin and iron panel are improved. No need for IV Venofer treatments. Etiology of iron deficiency unclear, secondary to blood loss due to menstrual period versus GI tract.  She is overdue for colonoscopy.  I have referred her to gastroenterology.  She has an appointment on 09/07/2022.Marland Kitchen

## 2022-08-03 NOTE — Progress Notes (Signed)
Hematology/Oncology Consult note [phone] [phone]      Patient Care Team: [name], FNP as PCP - General (Family Medicine)    CHIEF COMPLAINTS/REASON FOR VISIT:  Iron deficiency anemia  ASSESSMENT & PLAN:   Iron deficiency anemia Labs are reviewed and discussed with patient. Both hemoglobin and iron panel are improved. No need for IV Venofer treatments. Etiology of iron deficiency unclear, secondary to blood loss due to menstrual period versus GI tract.  She is overdue for colonoscopy.  I have referred her to gastroenterology.  She has an appointment on 09/07/2022..  Family history of cancer Patient declined genetic testing.  Orders Placed This Encounter  Procedures   CBC with Differential/Platelet    Standing Status:   Future    Standing Expiration Date:   08/02/2023   Retic Panel    Standing Status:   Future    Standing Expiration Date:   08/03/2023   Ferritin    Standing Status:   Future    Standing Expiration Date:   08/03/2023   Iron and TIBC([facility])    Standing Status:   Future    Standing Expiration Date:   08/03/2023   Follow-up in 6 months All questions were answered. The patient knows to call the clinic with any problems, questions or concerns.  Earlie Server, MD, PhD [facility] Hematology Oncology 08/02/2022     HISTORY OF PRESENTING ILLNESS:  Allison Beard is a  51 y.o.  female with PMH listed below who was referred to me for anemia Reviewed patient's recent labs that was done.  She was found to have abnormal CBC on 04/12/2022, with a hemoglobin of 8.6, MCV 77, normal white count and normal platelet counts. Reviewed patient's previous labs ordered by primary care physician's office, anemia is chronic onset , duration is since August 2021.  At that time hemoglobin was 11. Patient reports feeling tired and fatigued. She denies recent chest pain on exertion, shortness of breath on minimal exertion, pre-syncopal  episodes, or palpitations. She had not noticed any recent bleeding such as epistaxis, hematuria or hematochezia.  Her menstrual period is irregular, she has one period every couple of months.  Sometimes flow is heavy. She denies over the counter NSAID ingestion. She is not  on antiplatelets agents. Patient is overdue for colonoscopy screening. She denies any pica and eats a variety of diet.  Patient is a Pharmacist, hospital and teaches second grade.   INTERVAL HISTORY Allison Beard is a 51 y.o. female who has above history reviewed by me today presents for follow up visit for iron deficiency anemia.   Patient tolerates IV venofer. Fatigue is better. No new complaints.     MEDICAL HISTORY:  Past Medical History:  Diagnosis Date   Anemia    Anxiety    Depression    Dizziness    Family history of ovarian cancer    9/21 cancer genetic testing letter sent   Menorrhagia    Uterine fibroid     SURGICAL HISTORY: Past Surgical History:  Procedure Laterality Date   BREAST BIOPSY Left 2015   neg-, neddle bx/clip and bx   HYSTEROSCOPY WITH D & C  2008   endometrial polyp   MM BREAST STEREO BIOPSY LEFT ([place] HX)  04/26/2013   ORIF ANKLE FRACTURE Right 1989   WISDOM TOOTH EXTRACTION      SOCIAL HISTORY: Social History   Socioeconomic History   Marital status: Married    Spouse name: Not on file  Number of children: 3   Years of education: 16   Highest education level: Not on file  Occupational History   Occupation: teacher  Tobacco Use   Smoking status: Never   Smokeless tobacco: Never  Vaping Use   Vaping Use: Never used  Substance and Sexual Activity   Alcohol use: Yes    Alcohol/week: 0.0 standard drinks of alcohol    Comment: OCCASIONALLY   Drug use: No   Sexual activity: Yes    Partners: Male    Birth control/protection: None  Other Topics Concern   Not on file  Social History Narrative   Not on file   Social Determinants of Health   Financial Resource Strain:  Not on file  Food Insecurity: Not on file  Transportation Needs: Not on file  Physical Activity: Inactive (11/20/2017)   Exercise Vital Sign    Days of Exercise per Week: 0 days    Minutes of Exercise per Session: 0 min  Stress: No Stress Concern Present (11/20/2017)   [place]    Feeling of Stress : Only a little  Social Connections: Not on file  Intimate Partner Violence: Not on file    FAMILY HISTORY: Family History  Problem Relation Age of Onset   Depression Mother    Diabetes Mother    Bipolar disorder Mother    Heart disease Mother    Hypertension Mother    COPD Mother    Psychiatric Illness Father    Bipolar disorder Sister    Lung cancer Maternal Aunt    Lung cancer Maternal Aunt    Heart disease Maternal Uncle    Ovarian cancer Maternal Aunt 70   Breast cancer Neg Hx     ALLERGIES:  has No Known Allergies.  MEDICATIONS:  Current Outpatient Medications  Medication Sig Dispense Refill   FLUoxetine (PROZAC) 40 MG capsule Take 1 capsule (40 mg total) by mouth daily. Office visit is needed for further refills. 90 capsule 3   metFORMIN (GLUCOPHAGE-XR) 750 MG 24 hr tablet Take 1 tablet (750 mg total) by mouth daily with breakfast. 90 tablet 1   No current facility-administered medications for this visit.    Review of Systems  Constitutional:  Negative for appetite change, chills, fatigue and fever.  HENT:   Negative for hearing loss and voice change.   Eyes:  Negative for eye problems.  Respiratory:  Negative for chest tightness and cough.   Cardiovascular:  Negative for chest pain.  Gastrointestinal:  Negative for abdominal distention, abdominal pain and blood in stool.  Endocrine: Negative for hot flashes.  Genitourinary:  Negative for difficulty urinating and frequency.   Musculoskeletal:  Negative for arthralgias.  Skin:  Negative for itching and rash.  Neurological:  Negative for extremity  weakness.  Hematological:  Negative for adenopathy.  Psychiatric/Behavioral:  Negative for confusion.     PHYSICAL EXAMINATION:  Vitals:   08/02/22 1436  BP: (!) 110/54  Pulse: 80  Temp: (!) 97.4 F (36.3 C)  SpO2: 98%   Filed Weights   08/02/22 1436  Weight: 160 lb 1.6 oz (72.6 kg)    Physical Exam Constitutional:      General: She is not in acute distress. HENT:     Head: Normocephalic and atraumatic.  Eyes:     General: No scleral icterus. Cardiovascular:     Rate and Rhythm: Normal rate and regular rhythm.     Heart sounds: Normal heart sounds.  Pulmonary:  Effort: Pulmonary effort is normal. No respiratory distress.     Breath sounds: No wheezing.  Abdominal:     General: Bowel sounds are normal. There is no distension.     Palpations: Abdomen is soft.  Musculoskeletal:        General: No deformity. Normal range of motion.     Cervical back: Normal range of motion and neck supple.  Skin:    General: Skin is warm and dry.     Findings: No erythema or rash.  Neurological:     Mental Status: She is alert and oriented to person, place, and time. Mental status is at baseline.     Cranial Nerves: No cranial nerve deficit.     Coordination: Coordination normal.  Psychiatric:        Mood and Affect: Mood normal.     LABORATORY DATA:  I have reviewed the data as listed    Latest Ref Rng & Units 07/28/2022    2:49 PM 04/26/2022   11:47 AM 04/12/2022    3:48 PM  CBC  WBC 4.0 - 10.5 K/uL 6.0  6.3  4.8   Hemoglobin 12.0 - 15.0 g/dL 12.3  9.1  8.6   Hematocrit 36.0 - 46.0 % 38.9  31.0  28.9   Platelets 150 - 400 K/uL 242  295  273       Latest Ref Rng & Units 04/12/2022    3:48 PM 12/15/2016    9:36 AM 07/24/2015    9:10 AM  CMP  Glucose 70 - 99 mg/dL 85  94  103   BUN 6 - 24 mg/dL '17  9  13   '$ Creatinine 0.57 - 1.00 mg/dL 0.82  0.72  0.67   Sodium 134 - 144 mmol/L 141  139  141   Potassium 3.5 - 5.2 mmol/L 4.7  4.8  5.1   Chloride 96 - 106 mmol/L 105  100   102   CO2 20 - 29 mmol/L '24  26  24   '$ Calcium 8.7 - 10.2 mg/dL 9.5  9.9  9.4   Total Protein 6.0 - 8.5 g/dL 6.9  6.9  6.8   Total Bilirubin 0.0 - 1.2 mg/dL <0.2  <0.2  0.4   Alkaline Phos 44 - 121 IU/L 74  69  73   AST 0 - 40 IU/L '14  16  14   '$ ALT 0 - 32 IU/L '7  11  11       '$ Component Value Date/Time   IRON 62 07/28/2022 1449   TIBC 346 07/28/2022 1449   FERRITIN 89 07/28/2022 1449   IRONPCTSAT 18 07/28/2022 1449     RADIOGRAPHIC STUDIES: I have personally reviewed the radiological images as listed and agreed with the findings in the report. No results found.

## 2022-09-07 ENCOUNTER — Ambulatory Visit: Payer: BC Managed Care – PPO | Admitting: Gastroenterology

## 2022-09-07 ENCOUNTER — Encounter: Payer: Self-pay | Admitting: Gastroenterology

## 2022-09-07 VITALS — BP 116/71 | HR 65 | Temp 98.2°F | Ht 63.0 in | Wt 163.0 lb

## 2022-09-07 DIAGNOSIS — D500 Iron deficiency anemia secondary to blood loss (chronic): Secondary | ICD-10-CM | POA: Diagnosis not present

## 2022-09-07 MED ORDER — NA SULFATE-K SULFATE-MG SULF 17.5-3.13-1.6 GM/177ML PO SOLN: 1.0000 | Freq: Once | ORAL | 0 refills | Status: AC

## 2022-09-07 NOTE — Progress Notes (Signed)
Gastroenterology Consultation  Referring Provider:     [name], FNP Primary Care Physician:  [name], FNP Primary Gastroenterologist:  Dr. Allen Norris     Reason for Consultation:     Iron deficiency anemia        HPI:   Allison Beard is a 51 y.o. y/o female referred for consultation & management of iron deficiency anemia by Dr. Rollene Rotunda, Jaci Standard, FNP.  This patient comes in today after being found to have iron deficiency anemia.  The patient's most recent lab work as shown:  Component     Latest Ref Rng 04/24/2020 04/12/2022 04/26/2022 07/28/2022  Hemoglobin     12.0 - 15.0 g/dL 11.0 (L)  8.6 (L)  9.1 (L)  12.3   HCT     36.0 - 46.0 % 34.4  28.9 (L)  31.0 (L)  38.9   MCV     80.0 - 100.0 fL 87  77 (L)  79.1 (L)  89.0    The iron studies have also shown:  Component     Latest Ref Rng 04/26/2022 07/28/2022  Iron     28 - 170 ug/dL 29  62   TIBC     250 - 450 ug/dL 469 (H)  346   Saturation Ratios     10.4 - 31.8 % 6 (L)  18    The patient's for ferritin was 3 four months ago and has increased to 89 last month.  The patient had been triaged for a colonoscopy back in 2020 but at that time counseled prior to having the colonoscopy done. The patient reports that she's never had an EGD or colonoscopy.  She also reports that she is not had any black stools or bloody stools.  She denies any other signs of any GI bleeding.  She has been on iron to treat the iron deficiency with a good response.  Past Medical History:  Diagnosis Date   Anemia    Anxiety    Depression    Dizziness    Family history of ovarian cancer    9/21 cancer genetic testing letter sent   Menorrhagia    Uterine fibroid     Past Surgical History:  Procedure Laterality Date   BREAST BIOPSY Left 2015   neg-, neddle bx/clip and bx   HYSTEROSCOPY WITH D & C  2008   endometrial polyp   MM BREAST STEREO BIOPSY LEFT ([place] HX)  04/26/2013   ORIF ANKLE FRACTURE Right 1989   WISDOM TOOTH EXTRACTION       Prior to Admission medications   Medication Sig Start Date End Date Taking? Authorizing Provider  FLUoxetine (PROZAC) 40 MG capsule Take 1 capsule (40 mg total) by mouth daily. Office visit is needed for further refills. 10/25/21   Myles Gip, DO  metFORMIN (GLUCOPHAGE-XR) 750 MG 24 hr tablet Take 1 tablet (750 mg total) by mouth daily with breakfast. 04/13/22   [name], FNP    Family History  Problem Relation Age of Onset   Depression Mother    Diabetes Mother    Bipolar disorder Mother    Heart disease Mother    Hypertension Mother    COPD Mother    Psychiatric Illness Father    Bipolar disorder Sister    Lung cancer Maternal Aunt    Lung cancer Maternal Aunt    Heart disease Maternal Uncle    Ovarian cancer Maternal Aunt 70   Breast cancer Neg  Hx      Social History   Tobacco Use   Smoking status: Never   Smokeless tobacco: Never  Vaping Use   Vaping Use: Never used  Substance Use Topics   Alcohol use: Yes    Alcohol/week: 0.0 standard drinks of alcohol    Comment: OCCASIONALLY   Drug use: No    Allergies as of 09/07/2022   (No Known Allergies)    Review of Systems:    All systems reviewed and negative except where noted in HPI.   Physical Exam:  BP 116/71 (BP Location: Left Arm, Patient Position: Sitting, Cuff Size: Normal)   Pulse 65   Temp 98.2 F (36.8 C) (Oral)   Ht '5\' 3"'$  (1.6 m)   Wt 163 lb (73.9 kg)   LMP  (LMP Unknown)   BMI 28.87 kg/m  No LMP recorded (lmp unknown). Patient is postmenopausal. General:   Alert,  Well-developed, Well-nourished, pleasant and cooperative in NAD Head:  Normocephalic and atraumatic. Eyes:  Sclera clear, no icterus.   Conjunctiva pink. Ears:  Normal auditory acuity. Neck:  Supple; no masses or thyromegaly. Lungs:  Respirations even and unlabored.  Clear throughout to auscultation.   No wheezes, crackles, or rhonchi. No acute distress. Heart:  Regular rate and rhythm; no murmurs, clicks, rubs, or  gallops. Abdomen:  Normal bowel sounds.  No bruits.  Soft, non-tender and non-distended without masses, hepatosplenomegaly or hernias noted.  No guarding or rebound tenderness.  Negative Carnett sign.   Rectal:  Deferred.  Pulses:  Normal pulses noted. Extremities:  No clubbing or edema.  No cyanosis. Neurologic:  Alert and oriented x3;  grossly normal neurologically. Skin:  Intact without significant lesions or rashes.  No jaundice. Lymph Nodes:  No significant cervical adenopathy. Psych:  Alert and cooperative. Normal mood and affect.  Imaging Studies: No results found.  Assessment and Plan:   Allison Beard is a 51 y.o. y/o female who comes in today with a finding of iron deficiency anemia responding well to iron.  The patient has never had a colonoscopy a workup for her iron deficiency anemia.  The patient will be set up for an EGD and colonoscopy.  The patient has also been told that if the EGD and colonoscopy are negative then she may need to be set up for capsule endoscopy to evaluate the small bowel.  The patient has been explained the plan and agrees with it.    [name], MD. Marval Regal    Note: This dictation was prepared with Dragon dictation along with smaller phrase technology. Any transcriptional errors that result from this process are unintentional.

## 2022-09-09 NOTE — Addendum Note (Signed)
Addended by: Lurlean Nanny on: 09/09/2022 09:26 AM   Modules accepted: Orders

## 2022-09-14 ENCOUNTER — Encounter: Payer: Self-pay | Admitting: Gastroenterology

## 2022-09-22 NOTE — Anesthesia Preprocedure Evaluation (Addendum)
Anesthesia Evaluation  Patient identified by MRN, date of birth, ID band Patient awake    Reviewed: Allergy & Precautions, NPO status , Patient's Chart, lab work & pertinent test results  History of Anesthesia Complications Negative for: history of anesthetic complications  Airway Mallampati: I   Neck ROM: Full    Dental no notable dental hx.    Pulmonary neg pulmonary ROS   Pulmonary exam normal breath sounds clear to auscultation       Cardiovascular Exercise Tolerance: Good negative cardio ROS Normal cardiovascular exam Rhythm:Regular Rate:Normal     Neuro/Psych  Headaches PSYCHIATRIC DISORDERS Anxiety Depression    Vertigo     GI/Hepatic negative GI ROS,,,  Endo/Other  Prediabetes   Renal/GU negative Renal ROS     Musculoskeletal   Abdominal   Peds  Hematology  (+) Blood dyscrasia, anemia   Anesthesia Other Findings   Reproductive/Obstetrics Fibroids                              Anesthesia Physical Anesthesia Plan  ASA: 2  Anesthesia Plan: General   Post-op Pain Management:    Induction: Intravenous  PONV Risk Score and Plan: 3 and Propofol infusion, TIVA and Treatment may vary due to age or medical condition  Airway Management Planned: Natural Airway  Additional Equipment:   Intra-op Plan:   Post-operative Plan:   Informed Consent: I have reviewed the patients History and Physical, chart, labs and discussed the procedure including the risks, benefits and alternatives for the proposed anesthesia with the patient or authorized representative who has indicated his/her understanding and acceptance.       Plan Discussed with: CRNA  Anesthesia Plan Comments: (LMA/GETA backup discussed.  Patient consented for risks of anesthesia including but not limited to:  - adverse reactions to medications - damage to eyes, teeth, lips or other oral mucosa - nerve damage due to  positioning  - sore throat or hoarseness - damage to heart, brain, nerves, lungs, other parts of body or loss of life  Informed patient about role of CRNA in peri- and intra-operative care.  Patient voiced understanding.)       Anesthesia Quick Evaluation

## 2022-09-23 ENCOUNTER — Encounter
Admission: RE | Disposition: A | Discharge status: Home / Self Care | Payer: Self-pay | Source: Ambulatory Visit | Attending: Gastroenterology

## 2022-09-23 ENCOUNTER — Ambulatory Visit
Admission: RE | Admit: 2022-09-23 | Discharge: 2022-09-23 | Disposition: A | Discharge status: Home / Self Care | Payer: BC Managed Care – PPO | Source: Ambulatory Visit | Attending: Gastroenterology | Admitting: Gastroenterology

## 2022-09-23 ENCOUNTER — Ambulatory Visit: Payer: BC Managed Care – PPO | Admitting: Anesthesiology

## 2022-09-23 ENCOUNTER — Encounter: Payer: Self-pay | Admitting: Gastroenterology

## 2022-09-23 ENCOUNTER — Other Ambulatory Visit: Payer: Self-pay

## 2022-09-23 DIAGNOSIS — F418 Other specified anxiety disorders: Secondary | ICD-10-CM | POA: Diagnosis not present

## 2022-09-23 DIAGNOSIS — K317 Polyp of stomach and duodenum: Secondary | ICD-10-CM

## 2022-09-23 DIAGNOSIS — K5730 Diverticulosis of large intestine without perforation or abscess without bleeding: Secondary | ICD-10-CM | POA: Diagnosis not present

## 2022-09-23 DIAGNOSIS — K640 First degree hemorrhoids: Secondary | ICD-10-CM | POA: Diagnosis not present

## 2022-09-23 DIAGNOSIS — Z7984 Long term (current) use of oral hypoglycemic drugs: Secondary | ICD-10-CM | POA: Insufficient documentation

## 2022-09-23 DIAGNOSIS — D500 Iron deficiency anemia secondary to blood loss (chronic): Secondary | ICD-10-CM | POA: Diagnosis not present

## 2022-09-23 DIAGNOSIS — D509 Iron deficiency anemia, unspecified: Secondary | ICD-10-CM | POA: Insufficient documentation

## 2022-09-23 DIAGNOSIS — R7303 Prediabetes: Secondary | ICD-10-CM | POA: Diagnosis not present

## 2022-09-23 HISTORY — PX: POLYPECTOMY: SHX5525

## 2022-09-23 HISTORY — PX: COLONOSCOPY WITH PROPOFOL: SHX5780

## 2022-09-23 HISTORY — DX: Prediabetes: R73.03

## 2022-09-23 HISTORY — PX: ESOPHAGOGASTRODUODENOSCOPY (EGD) WITH PROPOFOL: SHX5813

## 2022-09-23 HISTORY — DX: Dizziness and giddiness: R42

## 2022-09-23 LAB — GLUCOSE, CAPILLARY: Glucose-Capillary: 111 mg/dL — ABNORMAL HIGH (ref 70–99)

## 2022-09-23 SURGERY — COLONOSCOPY WITH PROPOFOL
Anesthesia: General | Site: Rectum

## 2022-09-23 MED ORDER — SODIUM CHLORIDE 0.9 % IV SOLN: INTRAVENOUS | Status: DC

## 2022-09-23 MED ORDER — STERILE WATER FOR IRRIGATION IR SOLN
Status: DC | PRN
  Administered 2022-09-23: 150 mL

## 2022-09-23 MED ORDER — LACTATED RINGERS IV SOLN: INTRAVENOUS | Status: DC

## 2022-09-23 MED ORDER — PROPOFOL 10 MG/ML IV BOLUS
INTRAVENOUS | Status: DC | PRN
  Administered 2022-09-23: 100 mg via INTRAVENOUS
  Administered 2022-09-23: 50 mg via INTRAVENOUS
  Administered 2022-09-23: 25 mg via INTRAVENOUS
  Administered 2022-09-23: 50 mg via INTRAVENOUS
  Administered 2022-09-23: 25 mg via INTRAVENOUS

## 2022-09-23 MED ORDER — ACETAMINOPHEN 160 MG/5ML PO SOLN: 325.0000 mg | ORAL | Status: DC | PRN

## 2022-09-23 MED ORDER — ONDANSETRON HCL 4 MG/2ML IJ SOLN: 4.0000 mg | Freq: Once | INTRAMUSCULAR | Status: DC | PRN

## 2022-09-23 MED ORDER — ACETAMINOPHEN 325 MG PO TABS: 650.0000 mg | ORAL_TABLET | Freq: Once | ORAL | Status: DC | PRN

## 2022-09-23 MED ORDER — LIDOCAINE HCL (CARDIAC) PF 100 MG/5ML IV SOSY
PREFILLED_SYRINGE | INTRAVENOUS | Status: DC | PRN
  Administered 2022-09-23: 50 mg via INTRAVENOUS

## 2022-09-23 SURGICAL SUPPLY — 36 items
BALLN DILATOR 12-15 8 (BALLOONS)
BALLN DILATOR 15-18 8 (BALLOONS)
BALLN DILATOR CRE 0-12 8 (BALLOONS)
BALLN DILATOR ESOPH 8 10 CRE (MISCELLANEOUS) IMPLANT
BALLOON DILATOR 12-15 8 (BALLOONS) IMPLANT
BALLOON DILATOR 15-18 8 (BALLOONS) IMPLANT
BALLOON DILATOR CRE 0-12 8 (BALLOONS) IMPLANT
BLOCK BITE 60FR ADLT L/F GRN (MISCELLANEOUS) ×3 IMPLANT
CLIP HMST 235XBRD CATH ROT (MISCELLANEOUS) IMPLANT
CLIP RESOLUTION 360 11X235 (MISCELLANEOUS)
ELECT REM PT RETURN 9FT ADLT (ELECTROSURGICAL)
ELECTRODE REM PT RTRN 9FT ADLT (ELECTROSURGICAL) IMPLANT
FCP ESCP3.2XJMB 240X2.8X (MISCELLANEOUS)
FORCEPS BIOP RAD 4 LRG CAP 4 (CUTTING FORCEPS) IMPLANT
FORCEPS BIOP RJ4 240 W/NDL (MISCELLANEOUS)
FORCEPS ESCP3.2XJMB 240X2.8X (MISCELLANEOUS) IMPLANT
GOWN CVR UNV OPN BCK APRN NK (MISCELLANEOUS) ×6 IMPLANT
GOWN ISOL THUMB LOOP REG UNIV (MISCELLANEOUS) ×6
INJECTOR VARIJECT VIN23 (MISCELLANEOUS) IMPLANT
KIT DEFENDO VALVE AND CONN (KITS) IMPLANT
KIT PRC NS LF DISP ENDO (KITS) ×3 IMPLANT
KIT PROCEDURE OLYMPUS (KITS) ×3
MANIFOLD NEPTUNE II (INSTRUMENTS) ×3 IMPLANT
MARKER SPOT ENDO TATTOO 5ML (MISCELLANEOUS) IMPLANT
PROBE APC STR FIRE (PROBE) IMPLANT
RETRIEVER NET PLAT FOOD (MISCELLANEOUS) IMPLANT
RETRIEVER NET ROTH 2.5X230 LF (MISCELLANEOUS) IMPLANT
SNARE COLD EXACTO (MISCELLANEOUS) IMPLANT
SNARE SHORT THROW 13M SML OVAL (MISCELLANEOUS) IMPLANT
SNARE SHORT THROW 30M LRG OVAL (MISCELLANEOUS) IMPLANT
SNARE SNG USE RND 15MM (INSTRUMENTS) IMPLANT
SYR INFLATION 60ML (SYRINGE) IMPLANT
TRAP ETRAP POLY (MISCELLANEOUS) IMPLANT
VARIJECT INJECTOR VIN23 (MISCELLANEOUS)
WATER STERILE IRR 250ML POUR (IV SOLUTION) ×3 IMPLANT
WIRE CRE 18-20MM 8CM F G (MISCELLANEOUS) IMPLANT

## 2022-09-23 NOTE — H&P (Signed)
[name], MD [facility] [address], [place], [place] [address] Phone:[phone] Fax : [phone]  Primary Care Physician:  [name], FNP Primary Gastroenterologist:  Dr. Allen Norris  Pre-Procedure History & Physical: HPI:  Allison Beard is a 52 y.o. female is here for an endoscopy and colonoscopy.   Past Medical History:  Diagnosis Date   Anemia    Anxiety    Depression    Dizziness    Family history of ovarian cancer    9/21 cancer genetic testing letter sent   Menorrhagia    Pre-diabetes    Uterine fibroid    Vertigo    x1.  1-2 yrs ago    Past Surgical History:  Procedure Laterality Date   BREAST BIOPSY Left 2015   neg-, neddle bx/clip and bx   HYSTEROSCOPY WITH D & C  2008   endometrial polyp   MM BREAST STEREO BIOPSY LEFT ([place] HX)  04/26/2013   ORIF ANKLE FRACTURE Right 1989   WISDOM TOOTH EXTRACTION      Prior to Admission medications   Medication Sig Start Date End Date Taking? Authorizing Provider  FLUoxetine (PROZAC) 40 MG capsule Take 1 capsule (40 mg total) by mouth daily. Office visit is needed for further refills. 10/25/21  Yes Myles Gip, DO  metFORMIN (GLUCOPHAGE-XR) 750 MG 24 hr tablet Take 1 tablet (750 mg total) by mouth daily with breakfast. 04/13/22  Yes [name], FNP    Allergies as of 09/09/2022   (No Known Allergies)    Family History  Problem Relation Age of Onset   Depression Mother    Diabetes Mother    Bipolar disorder Mother    Heart disease Mother    Hypertension Mother    COPD Mother    Psychiatric Illness Father    Bipolar disorder Sister    Lung cancer Maternal Aunt    Lung cancer Maternal Aunt    Heart disease Maternal Uncle    Ovarian cancer Maternal Aunt 70   Breast cancer Neg Hx     Social History   Socioeconomic History   Marital status: Married    Spouse name: Not on file   Number of children: 3   Years of education: 16   Highest education level: Not on file  Occupational  History   Occupation: Pharmacist, hospital  Tobacco Use   Smoking status: Never   Smokeless tobacco: Never  Vaping Use   Vaping Use: Never used  Substance and Sexual Activity   Alcohol use: Yes    Alcohol/week: 0.0 standard drinks of alcohol    Comment: OCCASIONALLY   Drug use: No   Sexual activity: Yes    Partners: Male    Birth control/protection: None  Other Topics Concern   Not on file  Social History Narrative   Not on file   Social Determinants of Health   Financial Resource Strain: Not on file  Food Insecurity: Not on file  Transportation Needs: Not on file  Physical Activity: Inactive (11/20/2017)   Exercise Vital Sign    Days of Exercise per Week: 0 days    Minutes of Exercise per Session: 0 min  Stress: No Stress Concern Present (11/20/2017)   [place]    Feeling of Stress : Only a little  Social Connections: Not on file  Intimate Partner Violence: Not on file    Review of Systems: See HPI, otherwise negative ROS  Physical Exam: BP  114/66   Pulse 74   Temp (!) 97.5 F (36.4 C) (Temporal)   Resp 20   Ht '5\' 3"'$  (1.6 m)   Wt 71.2 kg   LMP 01/10/2022 (Approximate)   SpO2 98%   BMI 27.81 kg/m  General:   Alert,  pleasant and cooperative in NAD Head:  Normocephalic and atraumatic. Neck:  Supple; no masses or thyromegaly. Lungs:  Clear throughout to auscultation.    Heart:  Regular rate and rhythm. Abdomen:  Soft, nontender and nondistended. Normal bowel sounds, without guarding, and without rebound.   Neurologic:  Alert and  oriented x4;  grossly normal neurologically.  Impression/Plan: Allison Beard is here for an endoscopy and colonoscopy to be performed for IDA  Risks, benefits, limitations, and alternatives regarding  endoscopy and colonoscopy have been reviewed with the patient.  Questions have been answered.  All parties agreeable.   [name], MD  09/23/2022, 7:23 AM

## 2022-09-23 NOTE — Anesthesia Postprocedure Evaluation (Signed)
Anesthesia Post Note  Patient: Allison Beard  Procedure(s) Performed: COLONOSCOPY WITH PROPOFOL (Rectum) ESOPHAGOGASTRODUODENOSCOPY (EGD) WITH PROPOFOL (Mouth) POLYPECTOMY (Mouth)  Patient location during evaluation: PACU Anesthesia Type: General Level of consciousness: awake and alert, oriented and patient cooperative Pain management: pain level controlled Vital Signs Assessment: post-procedure vital signs reviewed and stable Respiratory status: spontaneous breathing, nonlabored ventilation and respiratory function stable Cardiovascular status: blood pressure returned to baseline and stable Postop Assessment: adequate PO intake Anesthetic complications: no   No notable events documented.   Last Vitals:  Vitals:   09/23/22 0845 09/23/22 0850  BP: (!) 86/74 (!) 92/56  Pulse: 72 (!) 59  Resp: 13 14  Temp:    SpO2: 98% 99%    Last Pain:  Vitals:   09/23/22 0850  TempSrc:   PainSc: 0-No pain                 Darrin Nipper

## 2022-09-23 NOTE — Op Note (Signed)
[facility] Gastroenterology Patient Name: Allison Beard Procedure Date: 09/23/2022 7:59 AM MRN: [id number] Account #: [id number] Date of Birth: 06-Mar-1971 Admit Type: Outpatient Age: 53 Room: [facility] OR ROOM 01 Gender: Female Note Status: Finalized Instrument Name: [id number] Procedure:             Colonoscopy Indications:           Iron deficiency anemia Providers:             [name] MD, MD Referring MD:          Jaci Standard. Rollene Rotunda (Referring MD) Medicines:             Propofol per Anesthesia Complications:         No immediate complications. Procedure:             Pre-Anesthesia Assessment:                        - Prior to the procedure, a History and Physical was                         performed, and patient medications and allergies were                         reviewed. The patient's tolerance of previous                         anesthesia was also reviewed. The risks and benefits                         of the procedure and the sedation options and risks                         were discussed with the patient. All questions were                         answered, and informed consent was obtained. Prior                         Anticoagulants: The patient has taken no anticoagulant                         or antiplatelet agents. ASA Grade Assessment: II - A                         patient with mild systemic disease. After reviewing                         the risks and benefits, the patient was deemed in                         satisfactory condition to undergo the procedure.                        After obtaining informed consent, the colonoscope was                         passed under direct vision. Throughout the procedure,  the patient's blood pressure, pulse, and oxygen                         saturations were monitored continuously. The                         Colonoscope was introduced through the anus and                          advanced to the the cecum, identified by appendiceal                         orifice and ileocecal valve. The colonoscopy was                         performed without difficulty. The patient tolerated                         the procedure well. The quality of the bowel                         preparation was excellent. Findings:      The perianal and digital rectal examinations were normal.      Non-bleeding internal hemorrhoids were found during retroflexion. The       hemorrhoids were Grade I (internal hemorrhoids that do not prolapse).      A few small-mouthed diverticula were found in the sigmoid colon and       descending colon. Impression:            - Non-bleeding internal hemorrhoids.                        - Diverticulosis in the sigmoid colon and in the                         descending colon.                        - No specimens collected. Recommendation:        - Discharge patient to home.                        - Resume previous diet.                        - Continue present medications.                        - To visualize the small bowel, perform video capsule                         endoscopy. Procedure Code(s):     --- Professional ---                        [phone], Colonoscopy, flexible; diagnostic, including                         collection of specimen(s) by brushing or washing, when  performed (separate procedure) Diagnosis Code(s):     --- Professional ---                        D50.9, Iron deficiency anemia, unspecified CPT copyright 2022 American Medical Association. All rights reserved. The codes documented in this report are preliminary and upon coder review may  be revised to meet current compliance requirements. [name] MD, MD 09/23/2022 8:39:22 AM This report has been signed electronically. Number of Addenda: 0 Note Initiated On: 09/23/2022 7:59 AM Scope Withdrawal Time: 0 hours 9 minutes 25 seconds  Total Procedure  Duration: 0 hours 13 minutes 22 seconds  Estimated Blood Loss:  Estimated blood loss: none. Estimated blood loss: none.      [facility]

## 2022-09-23 NOTE — Op Note (Signed)
[facility] Gastroenterology Patient Name: Allison Beard Procedure Date: 09/23/2022 8:02 AM MRN: [id number] Account #: [id number] Date of Birth: January 08, 1971 Admit Type: Outpatient Age: 52 Room: [facility] OR ROOM 01 Gender: Female Note Status: Finalized Instrument Name: [id number] Procedure:             Upper GI endoscopy Indications:           Iron deficiency anemia Providers:             [name] MD, MD Referring MD:          Jaci Standard. Rollene Rotunda (Referring MD) Medicines:             Propofol per Anesthesia Complications:         No immediate complications. Procedure:             Pre-Anesthesia Assessment:                        - Prior to the procedure, a History and Physical was                         performed, and patient medications and allergies were                         reviewed. The patient's tolerance of previous                         anesthesia was also reviewed. The risks and benefits                         of the procedure and the sedation options and risks                         were discussed with the patient. All questions were                         answered, and informed consent was obtained. Prior                         Anticoagulants: The patient has taken no anticoagulant                         or antiplatelet agents. ASA Grade Assessment: II - A                         patient with mild systemic disease. After reviewing                         the risks and benefits, the patient was deemed in                         satisfactory condition to undergo the procedure.                        After obtaining informed consent, the endoscope was                         passed under direct vision. Throughout the procedure,  the patient's blood pressure, pulse, and oxygen                         saturations were monitored continuously. The was                         introduced through the mouth, and advanced to the                          second part of duodenum. The upper GI endoscopy was                         accomplished without difficulty. The patient tolerated                         the procedure well. Findings:      The entire examined stomach was normal.      A single 6 mm sessile polyp was found in the second portion of the       duodenum. Biopsies were taken with a cold forceps for histology. Impression:            - Normal stomach.                        - A single duodenal polyp. Biopsied. Recommendation:        - Discharge patient to home.                        - Resume previous diet.                        - Continue present medications.                        - Await pathology results.                        - Perform a colonoscopy today. Procedure Code(s):     --- Professional ---                        [phone], Esophagogastroduodenoscopy, flexible,                         transoral; with biopsy, single or multiple Diagnosis Code(s):     --- Professional ---                        D50.9, Iron deficiency anemia, unspecified                        K31.7, Polyp of stomach and duodenum CPT copyright 2022 American Medical Association. All rights reserved. The codes documented in this report are preliminary and upon coder review may  be revised to meet current compliance requirements. [name] MD, MD 09/23/2022 8:21:28 AM This report has been signed electronically. Number of Addenda: 0 Note Initiated On: 09/23/2022 8:02 AM Estimated Blood Loss:  Estimated blood loss: none.      [facility]

## 2022-09-23 NOTE — Transfer of Care (Signed)
Immediate Anesthesia Transfer of Care Note  Patient: Allison Beard  Procedure(s) Performed: COLONOSCOPY WITH PROPOFOL (Rectum) ESOPHAGOGASTRODUODENOSCOPY (EGD) WITH PROPOFOL (Mouth) POLYPECTOMY (Mouth)  Patient Location: PACU  Anesthesia Type: General  Level of Consciousness: awake, alert  and patient cooperative  Airway and Oxygen Therapy: Patient Spontanous Breathing and Patient connected to supplemental oxygen  Post-op Assessment: Post-op Vital signs reviewed, Patient's Cardiovascular Status Stable, Respiratory Function Stable, Patent Airway and No signs of Nausea or vomiting  Post-op Vital Signs: Reviewed and stable  Complications: No notable events documented.

## 2022-09-26 ENCOUNTER — Encounter: Payer: Self-pay | Admitting: Gastroenterology

## 2022-09-27 ENCOUNTER — Encounter: Payer: Self-pay | Admitting: Gastroenterology

## 2022-09-27 LAB — SURGICAL PATHOLOGY

## 2022-10-13 ENCOUNTER — Ambulatory Visit: Payer: BC Managed Care – PPO | Admitting: Family Medicine

## 2022-10-13 ENCOUNTER — Encounter: Payer: Self-pay | Admitting: Family Medicine

## 2022-10-13 VITALS — BP 115/72 | HR 76 | Temp 97.7°F | Wt 164.2 lb

## 2022-10-13 DIAGNOSIS — M722 Plantar fascial fibromatosis: Secondary | ICD-10-CM | POA: Insufficient documentation

## 2022-10-13 NOTE — Progress Notes (Signed)
I,Connie R Striblin,acting as a Education administrator for [name], FNP.,have documented all relevant documentation on the behalf of [name], FNP,as directed by  [name], FNP while in the presence of [name], FNP.   Established patient visit   Patient: Allison Beard   DOB: 02/11/1971   52 y.o. Female  MRN: [id number] Visit Date: 10/13/2022  Today's healthcare provider: [name], FNP  Introduced to nurse practitioner role and practice setting.  All questions answered.  Discussed provider/patient relationship and expectations.  Subjective    HPI  Referral: Patient presents today for a referral for podiatry.   Pain  She reports chronic left foot pain. was not an injury that may have caused the pain. The pain started a few weeks ago and is worsening. The pain does not radiate . The pain is described as throbbing, is moderate in intensity, occurring intermittently.     ---------------------------------------------------------------------------------------------------   Medications: Outpatient Medications Prior to Visit  Medication Sig   FLUoxetine (PROZAC) 40 MG capsule Take 1 capsule (40 mg total) by mouth daily. Office visit is needed for further refills.   metFORMIN (GLUCOPHAGE-XR) 750 MG 24 hr tablet Take 1 tablet (750 mg total) by mouth daily with breakfast.   No facility-administered medications prior to visit.    Review of Systems  Last CBC Lab Results  Component Value Date   WBC 6.0 07/28/2022   HGB 12.3 07/28/2022   HCT 38.9 07/28/2022   MCV 89.0 07/28/2022   MCH 28.1 07/28/2022   RDW 17.5 (H) 07/28/2022   PLT 242 41/28/7867   Last metabolic panel Lab Results  Component Value Date   GLUCOSE 85 04/12/2022   NA 141 04/12/2022   K 4.7 04/12/2022   CL 105 04/12/2022   CO2 24 04/12/2022   BUN 17 04/12/2022   CREATININE 0.82 04/12/2022   EGFR 85 04/12/2022   CALCIUM 9.5 04/12/2022   PROT 6.9 04/12/2022   ALBUMIN 4.4 04/12/2022   LABGLOB  2.5 04/12/2022   AGRATIO 1.8 04/12/2022   BILITOT <0.2 04/12/2022   ALKPHOS 74 04/12/2022   AST 14 04/12/2022   ALT 7 04/12/2022   Last lipids Lab Results  Component Value Date   CHOL 207 (H) 04/12/2022   HDL 52 04/12/2022   LDLCALC 128 (H) 04/12/2022   TRIG 155 (H) 04/12/2022   CHOLHDL 4.0 04/12/2022   Last hemoglobin A1c Lab Results  Component Value Date   HGBA1C 6.0 (H) 04/12/2022   Last thyroid functions Lab Results  Component Value Date   TSH 2.640 04/12/2022       Objective    BP 115/72 (BP Location: Right Arm, Patient Position: Sitting, Cuff Size: Normal)   Pulse 76   Temp 97.7 F (36.5 C) (Oral)   Wt 164 lb 3.2 oz (74.5 kg)   LMP 01/10/2022 (Approximate)   SpO2 98%   BMI 29.09 kg/m  BP Readings from Last 3 Encounters:  10/13/22 115/72  09/23/22 (!) 92/56  09/07/22 116/71   Wt Readings from Last 3 Encounters:  10/13/22 164 lb 3.2 oz (74.5 kg)  09/23/22 157 lb (71.2 kg)  09/07/22 163 lb (73.9 kg)   SpO2 Readings from Last 3 Encounters:  10/13/22 98%  09/23/22 99%  08/02/22 98%      Physical Exam Vitals and nursing note reviewed.  Constitutional:      General: She is not in acute distress.    Appearance: Normal appearance. She is overweight. She  is not ill-appearing, toxic-appearing or diaphoretic.  HENT:     Head: Normocephalic and atraumatic.  Cardiovascular:     Rate and Rhythm: Normal rate and regular rhythm.     Pulses: Normal pulses.     Heart sounds: Normal heart sounds. No murmur heard.    No friction rub. No gallop.  Pulmonary:     Effort: Pulmonary effort is normal. No respiratory distress.     Breath sounds: Normal breath sounds. No stridor. No wheezing, rhonchi or rales.  Chest:     Chest wall: No tenderness.  Abdominal:     General: Bowel sounds are normal.     Palpations: Abdomen is soft.  Musculoskeletal:        General: No swelling, tenderness, deformity or signs of injury. Normal range of motion.     Right lower leg:  No edema.     Left lower leg: No edema.  Skin:    General: Skin is warm and dry.     Capillary Refill: Capillary refill takes less than 2 seconds.     Coloration: Skin is not jaundiced or pale.     Findings: No bruising, erythema, lesion or rash.  Neurological:     General: No focal deficit present.     Mental Status: She is alert and oriented to person, place, and time. Mental status is at baseline.     Cranial Nerves: No cranial nerve deficit.     Sensory: No sensory deficit.     Motor: No weakness.     Coordination: Coordination normal.  Psychiatric:        Mood and Affect: Mood normal.        Behavior: Behavior normal.        Thought Content: Thought content normal.        Judgment: Judgment normal.     No results found for any visits on 10/13/22.  Assessment & Plan     Problem List Items Addressed This Visit       Musculoskeletal and Integument   Plantar fasciitis - Primary    Reports L foot pain; no known injury. Has tried multiple OTC treatments and stretching. Wishes to be seen by podiatry.       Relevant Orders   Ambulatory referral to Podiatry   Return in about 6 months (around 04/13/2023), or if symptoms worsen or fail to improve, for annual examination.     Vonna Kotyk, FNP, have reviewed all documentation for this visit. The documentation on 10/17/22 for the exam, diagnosis, procedures, and orders are all accurate and complete.  [name], [place] [phone] (phone) [phone] (fax)  [place]

## 2022-10-17 NOTE — Assessment & Plan Note (Signed)
Reports L foot pain; no known injury. Has tried multiple OTC treatments and stretching. Wishes to be seen by podiatry.

## 2022-10-27 ENCOUNTER — Other Ambulatory Visit: Payer: Self-pay | Admitting: Family Medicine

## 2022-10-27 DIAGNOSIS — R7303 Prediabetes: Secondary | ICD-10-CM

## 2022-10-28 ENCOUNTER — Ambulatory Visit: Payer: BC Managed Care – PPO | Admitting: Podiatry

## 2022-11-15 ENCOUNTER — Other Ambulatory Visit: Payer: Self-pay | Admitting: Family Medicine

## 2022-11-15 DIAGNOSIS — F3342 Major depressive disorder, recurrent, in full remission: Secondary | ICD-10-CM

## 2022-11-15 DIAGNOSIS — F411 Generalized anxiety disorder: Secondary | ICD-10-CM

## 2022-11-15 NOTE — Telephone Encounter (Signed)
Medication Refill - Medication: FLUoxetine (PROZAC) 40 MG capsule [id number]    Has the patient contacted their pharmacy? Yes.   (Agent: If no, request that the patient contact the pharmacy for the refill. If patient does not wish to contact the pharmacy document the reason why and proceed with request.) (Agent: If yes, when and what did the pharmacy advise?)request sent to office   Preferred Pharmacy (with phone number or street name): [facility] [place], [address]  Has the patient been seen for an appointment in the last year OR does the patient have an upcoming appointment? Yes.    Agent: Please be advised that RX refills may take up to 3 business days. We ask that you follow-up with your pharmacy.

## 2023-02-02 ENCOUNTER — Inpatient Hospital Stay: Payer: BC Managed Care – PPO | Attending: Oncology

## 2023-02-02 DIAGNOSIS — D509 Iron deficiency anemia, unspecified: Secondary | ICD-10-CM | POA: Insufficient documentation

## 2023-02-02 DIAGNOSIS — D500 Iron deficiency anemia secondary to blood loss (chronic): Secondary | ICD-10-CM

## 2023-02-02 LAB — CBC WITH DIFFERENTIAL/PLATELET
Abs Immature Granulocytes: 0.02 10*3/uL (ref 0.00–0.07)
Basophils Absolute: 0 10*3/uL (ref 0.0–0.1)
Basophils Relative: 1 %
Eosinophils Absolute: 0.1 10*3/uL (ref 0.0–0.5)
Eosinophils Relative: 2 %
HCT: 37.2 % (ref 36.0–46.0)
Hemoglobin: 12.2 g/dL (ref 12.0–15.0)
Immature Granulocytes: 0 %
Lymphocytes Relative: 35 %
Lymphs Abs: 2.5 10*3/uL (ref 0.7–4.0)
MCH: 30.3 pg (ref 26.0–34.0)
MCHC: 32.8 g/dL (ref 30.0–36.0)
MCV: 92.5 fL (ref 80.0–100.0)
Monocytes Absolute: 0.5 10*3/uL (ref 0.1–1.0)
Monocytes Relative: 7 %
Neutro Abs: 4 10*3/uL (ref 1.7–7.7)
Neutrophils Relative %: 55 %
Platelets: 237 10*3/uL (ref 150–400)
RBC: 4.02 MIL/uL (ref 3.87–5.11)
RDW: 11.7 % (ref 11.5–15.5)
WBC: 7.2 10*3/uL (ref 4.0–10.5)
nRBC: 0 % (ref 0.0–0.2)

## 2023-02-02 LAB — IRON AND TIBC
Iron: 67 ug/dL (ref 28–170)
Saturation Ratios: 19 % (ref 10.4–31.8)
TIBC: 353 ug/dL (ref 250–450)
UIBC: 286 ug/dL

## 2023-02-02 LAB — RETIC PANEL
Immature Retic Fract: 9.2 % (ref 2.3–15.9)
RBC.: 4.1 MIL/uL (ref 3.87–5.11)
Retic Count, Absolute: 52.9 10*3/uL (ref 19.0–186.0)
Retic Ct Pct: 1.3 % (ref 0.4–3.1)
Reticulocyte Hemoglobin: 33.1 pg (ref >27.9)

## 2023-02-02 LAB — FERRITIN: Ferritin: 41 ng/mL (ref 11–307)

## 2023-02-09 ENCOUNTER — Inpatient Hospital Stay: Payer: BC Managed Care – PPO | Admitting: Oncology

## 2023-02-09 ENCOUNTER — Encounter: Payer: Self-pay | Admitting: Oncology

## 2023-02-09 VITALS — BP 107/73 | HR 73 | Temp 96.0°F | Resp 18 | Wt 155.6 lb

## 2023-02-09 DIAGNOSIS — D509 Iron deficiency anemia, unspecified: Secondary | ICD-10-CM | POA: Diagnosis not present

## 2023-02-09 DIAGNOSIS — D500 Iron deficiency anemia secondary to blood loss (chronic): Secondary | ICD-10-CM | POA: Diagnosis not present

## 2023-02-09 NOTE — Assessment & Plan Note (Addendum)
Lab Results  Component Value Date   HGB 12.2 02/02/2023   TIBC 353 02/02/2023   IRONPCTSAT 19 02/02/2023   FERRITIN 41 02/02/2023    No need for IV Venofer.

## 2023-02-09 NOTE — Progress Notes (Signed)
Hematology/Oncology Consult note [phone] [phone]      Patient Care Team: [name], FNP as PCP - General (Family Medicine)    CHIEF COMPLAINTS/REASON FOR VISIT:  Iron deficiency anemia  ASSESSMENT & PLAN:   Iron deficiency anemia Lab Results  Component Value Date   HGB 12.2 02/02/2023   TIBC 353 02/02/2023   IRONPCTSAT 19 02/02/2023   FERRITIN 41 02/02/2023    No need for IV Venofer.   All questions were answered. Patient is discharged from my clinic. I recommend patient to continue follow up with primary care physician. Patient may re-establish care in the future if clinically indicated.   Rickard Patience, MD, PhD [facility] Hematology Oncology 02/09/2023     HISTORY OF PRESENTING ILLNESS:  Allison Beard is a  52 y.o.  female with PMH listed below who was referred to me for anemia Reviewed patient's recent labs that was done.  She was found to have abnormal CBC on 04/12/2022, with a hemoglobin of 8.6, MCV 77, normal white count and normal platelet counts. Reviewed patient's previous labs ordered by primary care physician's office, anemia is chronic onset , duration is since August 2021.  At that time hemoglobin was 11. Patient reports feeling tired and fatigued. She denies recent chest pain on exertion, shortness of breath on minimal exertion, pre-syncopal episodes, or palpitations. She had not noticed any recent bleeding such as epistaxis, hematuria or hematochezia.  Her menstrual period is irregular, she has one period every couple of months.  Sometimes flow is heavy. She denies over the counter NSAID ingestion. She is not  on antiplatelets agents. Patient is overdue for colonoscopy screening. She denies any pica and eats a variety of diet.  Patient is a Runner, broadcasting/film/video and teaches second grade.   INTERVAL HISTORY Allison Beard is a 52 y.o. female who has above history reviewed by me today presents for follow up visit for iron  deficiency anemia.  Patient tolerates IV venofer. Fatigue is better. No new complaints.  09/23/2022 EGD showed normal stomach, duodenal polyp - negative for dysplasia/malignancy. Colonoscopy showed non bleeding internal hemorrhoids. Diverticulosis in the sigmoid colon and descending colon.    MEDICAL HISTORY:  Past Medical History:  Diagnosis Date   Anemia    Anxiety    Depression    Dizziness    Family history of ovarian cancer    9/21 cancer genetic testing letter sent   Menorrhagia    Pre-diabetes    Uterine fibroid    Vertigo    x1.  1-2 yrs ago    SURGICAL HISTORY: Past Surgical History:  Procedure Laterality Date   BREAST BIOPSY Left 2015   neg-, neddle bx/clip and bx   COLONOSCOPY WITH PROPOFOL N/A 09/23/2022   Procedure: COLONOSCOPY WITH PROPOFOL;  Surgeon: Midge Minium, MD;  Location: [facility];  Service: Endoscopy;  Laterality: N/A;   ESOPHAGOGASTRODUODENOSCOPY (EGD) WITH PROPOFOL N/A 09/23/2022   Procedure: ESOPHAGOGASTRODUODENOSCOPY (EGD) WITH PROPOFOL;  Surgeon: Midge Minium, MD;  Location: [facility];  Service: Endoscopy;  Laterality: N/A;   HYSTEROSCOPY WITH D & C  2008   endometrial polyp   MM BREAST STEREO BIOPSY LEFT ([facility] HX)  04/26/2013   ORIF ANKLE FRACTURE Right 1989   POLYPECTOMY  09/23/2022   Procedure: POLYPECTOMY;  Surgeon: Midge Minium, MD;  Location: [facility];  Service: Endoscopy;;   WISDOM TOOTH EXTRACTION      SOCIAL HISTORY: Social History   Socioeconomic History   Marital status:  Married    Spouse name: Not on file   Number of children: 3   Years of education: 36   Highest education level: Not on file  Occupational History   Occupation: teacher  Tobacco Use   Smoking status: Never   Smokeless tobacco: Never  Vaping Use   Vaping Use: Never used  Substance and Sexual Activity   Alcohol use: Yes    Alcohol/week: 0.0 standard drinks of alcohol    Comment: OCCASIONALLY   Drug use: No   Sexual activity:  Yes    Partners: Male    Birth control/protection: None  Other Topics Concern   Not on file  Social History Narrative   Not on file   Social Determinants of Health   Financial Resource Strain: Not on file  Food Insecurity: Not on file  Transportation Needs: Not on file  Physical Activity: Inactive (11/20/2017)   Exercise Vital Sign    Days of Exercise per Week: 0 days    Minutes of Exercise per Session: 0 min  Stress: No Stress Concern Present (11/20/2017)   [employer] of Occupational Health - Occupational Stress Questionnaire    Feeling of Stress : Only a little  Social Connections: Not on file  Intimate Partner Violence: Not on file    FAMILY HISTORY: Family History  Problem Relation Age of Onset   Depression Mother    Diabetes Mother    Bipolar disorder Mother    Heart disease Mother    Hypertension Mother    COPD Mother    Psychiatric Illness Father    Bipolar disorder Sister    Lung cancer Maternal Aunt    Lung cancer Maternal Aunt    Heart disease Maternal Uncle    Ovarian cancer Maternal Aunt 57   Breast cancer Neg Hx     ALLERGIES:  has No Known Allergies.  MEDICATIONS:  Current Outpatient Medications  Medication Sig Dispense Refill   FLUoxetine (PROZAC) 40 MG capsule TAKE ONE CAPSULE BY MOUTH DAILY 90 capsule 0   metFORMIN (GLUCOPHAGE-XR) 750 MG 24 hr tablet TAKE 1 TABLET(750 MG) BY MOUTH DAILY WITH BREAKFAST 90 tablet 1   No current facility-administered medications for this visit.    Review of Systems  Constitutional:  Negative for appetite change, chills, fatigue and fever.  HENT:   Negative for hearing loss and voice change.   Eyes:  Negative for eye problems.  Respiratory:  Negative for chest tightness and cough.   Cardiovascular:  Negative for chest pain.  Gastrointestinal:  Negative for abdominal distention, abdominal pain and blood in stool.  Endocrine: Negative for hot flashes.  Genitourinary:  Negative for difficulty urinating and  frequency.   Musculoskeletal:  Negative for arthralgias.  Skin:  Negative for itching and rash.  Neurological:  Negative for extremity weakness.  Hematological:  Negative for adenopathy.  Psychiatric/Behavioral:  Negative for confusion.     PHYSICAL EXAMINATION:  Vitals:   02/09/23 1503  BP: 107/73  Pulse: 73  Resp: 18  Temp: (!) 96 F (35.6 C)  SpO2: 96%   Filed Weights   02/09/23 1503  Weight: 155 lb 9.6 oz (70.6 kg)    Physical Exam Constitutional:      General: She is not in acute distress. HENT:     Head: Normocephalic and atraumatic.  Eyes:     General: No scleral icterus. Cardiovascular:     Rate and Rhythm: Normal rate and regular rhythm.     Heart sounds: Normal heart sounds.  Pulmonary:     Effort: Pulmonary effort is normal. No respiratory distress.     Breath sounds: No wheezing.  Abdominal:     General: Bowel sounds are normal. There is no distension.     Palpations: Abdomen is soft.  Musculoskeletal:        General: No deformity. Normal range of motion.     Cervical back: Normal range of motion and neck supple.  Skin:    General: Skin is warm and dry.     Findings: No erythema or rash.  Neurological:     Mental Status: She is alert and oriented to person, place, and time. Mental status is at baseline.     Cranial Nerves: No cranial nerve deficit.     Coordination: Coordination normal.  Psychiatric:        Mood and Affect: Mood normal.      LABORATORY DATA:  I have reviewed the data as listed    Latest Ref Rng & Units 02/02/2023    3:23 PM 07/28/2022    2:49 PM 04/26/2022   11:47 AM  CBC  WBC 4.0 - 10.5 K/uL 7.2  6.0  6.3   Hemoglobin 12.0 - 15.0 g/dL 16.1  09.6  9.1   Hematocrit 36.0 - 46.0 % 37.2  38.9  31.0   Platelets 150 - 400 K/uL 237  242  295       Latest Ref Rng & Units 04/12/2022    3:48 PM 12/15/2016    9:36 AM 07/24/2015    9:10 AM  CMP  Glucose 70 - 99 mg/dL 85  94  045   BUN 6 - 24 mg/dL 17  9  13    Creatinine 0.57 -  1.00 mg/dL 4.09  8.11  9.14   Sodium 134 - 144 mmol/L 141  139  141   Potassium 3.5 - 5.2 mmol/L 4.7  4.8  5.1   Chloride 96 - 106 mmol/L 105  100  102   CO2 20 - 29 mmol/L 24  26  24    Calcium 8.7 - 10.2 mg/dL 9.5  9.9  9.4   Total Protein 6.0 - 8.5 g/dL 6.9  6.9  6.8   Total Bilirubin 0.0 - 1.2 mg/dL <7.8  <2.9  0.4   Alkaline Phos 44 - 121 IU/L 74  69  73   AST 0 - 40 IU/L 14  16  14    ALT 0 - 32 IU/L 7  11  11        Component Value Date/Time   IRON 67 02/02/2023 1523   TIBC 353 02/02/2023 1523   FERRITIN 41 02/02/2023 1523   IRONPCTSAT 19 02/02/2023 1523     RADIOGRAPHIC STUDIES: I have personally reviewed the radiological images as listed and agreed with the findings in the report. No results found.

## 2023-02-10 ENCOUNTER — Other Ambulatory Visit: Payer: Self-pay | Admitting: Family Medicine

## 2023-02-10 DIAGNOSIS — F3342 Major depressive disorder, recurrent, in full remission: Secondary | ICD-10-CM

## 2023-02-10 DIAGNOSIS — F411 Generalized anxiety disorder: Secondary | ICD-10-CM

## 2023-02-10 NOTE — Telephone Encounter (Signed)
Requested Prescriptions  Pending Prescriptions Disp Refills   FLUoxetine (PROZAC) 40 MG capsule [Pharmacy Med Name: FLUOXETINE 40MG  CAPSULES] 90 capsule 0    Sig: TAKE 1 CAPSULE BY MOUTH DAILY     Psychiatry:  Antidepressants - SSRI Passed - 02/10/2023  3:12 AM      Passed - Completed PHQ-2 or PHQ-9 in the last 360 days      Passed - Valid encounter within last 6 months    Recent Outpatient Visits           4 months ago Plantar fasciitis   [facility] [name], FNP   10 months ago Annual physical exam   [facility] [name], FNP   1 year ago Screening for colon cancer   [facility] Caro Laroche, DO   1 year ago Generalized anxiety disorder   [facility] Merita Norton T, FNP   2 years ago Colon cancer screening   [facility] Joycelyn Man [place], [place]

## 2023-04-21 ENCOUNTER — Other Ambulatory Visit: Payer: Self-pay | Admitting: Family Medicine

## 2023-04-21 DIAGNOSIS — R7303 Prediabetes: Secondary | ICD-10-CM

## 2023-04-21 NOTE — Telephone Encounter (Signed)
Requested medications are due for refill today.  yes  Requested medications are on the active medications list.  yes  Last refill. 10/27/2022 #90 1 rf  Future visit scheduled.   no  Notes to clinic.  Labs are expired.    Requested Prescriptions  Pending Prescriptions Disp Refills   metFORMIN (GLUCOPHAGE-XR) 750 MG 24 hr tablet [Pharmacy Med Name: METFORMIN ER 750MG  24HR TABS] 90 tablet 1    Sig: TAKE 1 TABLET(750 MG) BY MOUTH DAILY WITH BREAKFAST     Endocrinology:  Diabetes - Biguanides Failed - 04/21/2023  3:13 AM      Failed - Cr in normal range and within 360 days    Creatinine, Ser  Date Value Ref Range Status  04/12/2022 0.82 0.57 - 1.00 mg/dL Final         Failed - HBA1C is between 0 and 7.9 and within 180 days    Hgb A1c MFr Bld  Date Value Ref Range Status  04/12/2022 6.0 (H) 4.8 - 5.6 % Final    Comment:             Prediabetes: 5.7 - 6.4          Diabetes: >6.4          Glycemic control for adults with diabetes: <7.0          Failed - eGFR in normal range and within 360 days    GFR calc Af Amer  Date Value Ref Range Status  12/15/2016 115 >59 mL/min/1.73 Final   GFR calc non Af Amer  Date Value Ref Range Status  12/15/2016 99 >59 mL/min/1.73 Final   eGFR  Date Value Ref Range Status  04/12/2022 85 >59 mL/min/1.73 Final         Failed - B12 Level in normal range and within 720 days    No results found for: "VITAMINB12"       Failed - Valid encounter within last 6 months    Recent Outpatient Visits           6 months ago Plantar fasciitis   [facility] Allison Kindle, Allison Beard   1 year ago Annual physical exam   [facility] Allison Beard Allison Kindle, Allison Beard   1 year ago Screening for colon cancer   [facility] Allison Laroche, Allison Beard   2 years ago Generalized anxiety disorder   [facility] Allison Norton Beard, Allison Beard   3 years ago Colon cancer screening   [place]  [facility] Allison Beard M, [place]              Passed - CBC within normal limits and completed in the last 12 months    WBC  Date Value Ref Range Status  02/02/2023 7.2 4.0 - 10.5 K/uL Final   RBC  Date Value Ref Range Status  02/02/2023 4.02 3.87 - 5.11 MIL/uL Final   RBC.  Date Value Ref Range Status  02/02/2023 4.10 3.87 - 5.11 MIL/uL Final   Hemoglobin  Date Value Ref Range Status  02/02/2023 12.2 12.0 - 15.0 g/dL Final  16/06/9603 8.6 (L) 11.1 - 15.9 g/dL Final   HCT  Date Value Ref Range Status  02/02/2023 37.2 36.0 - 46.0 % Final   Hematocrit  Date Value Ref Range Status  04/12/2022 28.9 (L) 34.0 - 46.6 % Final   MCHC  Date Value Ref Range Status  02/02/2023 32.8 30.0 - 36.0  g/dL Final   [facility]  Date Value Ref Range Status  02/02/2023 30.3 26.0 - 34.0 pg Final   MCV  Date Value Ref Range Status  02/02/2023 92.5 80.0 - 100.0 fL Final  04/12/2022 77 (L) 79 - 97 fL Final   No results found for: "PLTCOUNTKUC", "LABPLAT", "POCPLA" RDW  Date Value Ref Range Status  02/02/2023 11.7 11.5 - 15.5 % Final  04/12/2022 15.9 (H) 11.7 - 15.4 % Final

## 2023-05-12 ENCOUNTER — Other Ambulatory Visit: Payer: Self-pay | Admitting: Family Medicine

## 2023-05-12 DIAGNOSIS — F3342 Major depressive disorder, recurrent, in full remission: Secondary | ICD-10-CM

## 2023-05-12 DIAGNOSIS — F411 Generalized anxiety disorder: Secondary | ICD-10-CM

## 2023-05-12 NOTE — Telephone Encounter (Signed)
Needs appt scheduled prior to courtesy refill

## 2023-05-12 NOTE — Telephone Encounter (Signed)
Called pt and scheduled appt

## 2023-05-12 NOTE — Telephone Encounter (Signed)
Requested Prescriptions  Pending Prescriptions Disp Refills   FLUoxetine (PROZAC) 40 MG capsule [Pharmacy Med Name: FLUOXETINE 40MG  CAPSULES] 90 capsule 0    Sig: TAKE 1 CAPSULE BY MOUTH DAILY     Psychiatry:  Antidepressants - SSRI Failed - 05/12/2023  3:12 AM      Failed - Completed PHQ-2 or PHQ-9 in the last 360 days      Failed - Valid encounter within last 6 months    Recent Outpatient Visits           7 months ago Plantar fasciitis   [facility] [name], FNP   1 year ago Annual physical exam   [facility] [name], FNP   1 year ago Screening for colon cancer   [facility] Caro Laroche, DO   2 years ago Generalized anxiety disorder   [facility] Merita Norton T, FNP   3 years ago Colon cancer screening   [facility] [place], Alessandra [place], [place]       Future Appointments             In 6 days [name], FNP [facility]

## 2023-05-18 ENCOUNTER — Ambulatory Visit: Payer: BC Managed Care – PPO | Admitting: Family Medicine

## 2023-05-18 ENCOUNTER — Other Ambulatory Visit: Payer: Self-pay | Admitting: Family Medicine

## 2023-05-18 ENCOUNTER — Encounter: Payer: Self-pay | Admitting: Family Medicine

## 2023-05-18 VITALS — BP 107/74 | HR 66 | Ht 63.0 in | Wt 160.1 lb

## 2023-05-18 DIAGNOSIS — F3342 Major depressive disorder, recurrent, in full remission: Secondary | ICD-10-CM

## 2023-05-18 DIAGNOSIS — D508 Other iron deficiency anemias: Secondary | ICD-10-CM

## 2023-05-18 DIAGNOSIS — R7303 Prediabetes: Secondary | ICD-10-CM

## 2023-05-18 DIAGNOSIS — Z1231 Encounter for screening mammogram for malignant neoplasm of breast: Secondary | ICD-10-CM

## 2023-05-18 DIAGNOSIS — Z114 Encounter for screening for human immunodeficiency virus [HIV]: Secondary | ICD-10-CM

## 2023-05-18 DIAGNOSIS — F411 Generalized anxiety disorder: Secondary | ICD-10-CM

## 2023-05-18 DIAGNOSIS — Z5320 Procedure and treatment not carried out because of patient's decision for unspecified reasons: Secondary | ICD-10-CM

## 2023-05-18 DIAGNOSIS — Z1159 Encounter for screening for other viral diseases: Secondary | ICD-10-CM

## 2023-05-18 MED ORDER — METFORMIN HCL ER 750 MG PO TB24: 750.0000 mg | ORAL_TABLET | Freq: Every day | ORAL | 3 refills | Status: DC

## 2023-05-18 MED ORDER — FLUOXETINE HCL 40 MG PO CAPS: 40.0000 mg | ORAL_CAPSULE | Freq: Every day | ORAL | 3 refills | Status: DC

## 2023-05-18 NOTE — Assessment & Plan Note (Signed)
Chronic, stable Repeat A1c Continue to recommend balanced, lower carb meals. Smaller meal size, adding snacks. Choosing water as drink of choice and increasing purposeful exercise. Continues on metformin 750 mg once daily

## 2023-05-18 NOTE — Assessment & Plan Note (Signed)
Chronic, stable No concerns in regards to depression with use of prozac 40 mg Continue to monitor

## 2023-05-18 NOTE — Assessment & Plan Note (Signed)
Chronic, stable Continue prozac 40 mg Refills provided

## 2023-05-18 NOTE — Progress Notes (Signed)
Established patient visit   Patient: Allison Beard   DOB: 1971-09-11   52 y.o. Female  MRN: [id number] Visit Date: 05/18/2023  Today's healthcare provider: [name], FNP  Introduced to nurse practitioner role and practice setting.  All questions answered.  Discussed provider/patient relationship and expectations.  Subjective    HPI   Medications: Outpatient Medications Prior to Visit  Medication Sig   [DISCONTINUED] FLUoxetine (PROZAC) 40 MG capsule TAKE 1 CAPSULE BY MOUTH DAILY   [DISCONTINUED] metFORMIN (GLUCOPHAGE-XR) 750 MG 24 hr tablet TAKE 1 TABLET(750 MG) BY MOUTH DAILY WITH BREAKFAST   No facility-administered medications prior to visit.     Objective    BP 107/74 (BP Location: Right Arm, Patient Position: Sitting, Cuff Size: Normal)   Pulse 66   Ht 5\' 3"  (1.6 m)   Wt 160 lb 1.6 oz (72.6 kg)   LMP 01/10/2022   SpO2 99%   BMI 28.36 kg/m   Physical Exam Vitals and nursing note reviewed.  Constitutional:      General: She is not in acute distress.    Appearance: Normal appearance. She is overweight. She is not ill-appearing, toxic-appearing or diaphoretic.  HENT:     Head: Normocephalic and atraumatic.  Cardiovascular:     Rate and Rhythm: Normal rate and regular rhythm.     Pulses: Normal pulses.     Heart sounds: Normal heart sounds. No murmur heard.    No friction rub. No gallop.  Pulmonary:     Effort: Pulmonary effort is normal. No respiratory distress.     Breath sounds: Normal breath sounds. No stridor. No wheezing, rhonchi or rales.  Chest:     Chest wall: No tenderness.  Abdominal:     General: Bowel sounds are normal.     Palpations: Abdomen is soft.  Musculoskeletal:        General: No swelling, tenderness, deformity or signs of injury. Normal range of motion.     Right lower leg: No edema.     Left lower leg: No edema.  Skin:    General: Skin is warm and dry.     Capillary Refill: Capillary refill takes less than 2 seconds.      Coloration: Skin is not jaundiced or pale.     Findings: No bruising, erythema, lesion or rash.  Neurological:     General: No focal deficit present.     Mental Status: She is alert and oriented to person, place, and time. Mental status is at baseline.     Cranial Nerves: No cranial nerve deficit.     Sensory: No sensory deficit.     Motor: No weakness.     Coordination: Coordination normal.  Psychiatric:        Mood and Affect: Mood normal.        Behavior: Behavior normal.        Thought Content: Thought content normal.        Judgment: Judgment normal.     No results found for any visits on 05/18/23.  Assessment & Plan     Problem List Items Addressed This Visit       Other   Iron deficiency anemia (Chronic)    Chronic, improved Repeat CBC      Relevant Orders   HIV antibody (with reflex)   Hepatitis C Antibody   CBC with Differential/Platelet   Comprehensive Metabolic Panel (CMET)   TSH   Lipid panel   Hemoglobin A1c   Anxiety disorder -  Primary    Chronic, stable Continue prozac 40 mg Refills provided       Relevant Medications   FLUoxetine (PROZAC) 40 MG capsule   Pap smear of cervix declined    Plans to establish with [facility] midwifery team       Prediabetes    Chronic, stable Repeat A1c Continue to recommend balanced, lower carb meals. Smaller meal size, adding snacks. Choosing water as drink of choice and increasing purposeful exercise. Continues on metformin 750 mg once daily       Relevant Medications   metFORMIN (GLUCOPHAGE-XR) 750 MG 24 hr tablet   Other Relevant Orders   HIV antibody (with reflex)   Hepatitis C Antibody   CBC with Differential/Platelet   Comprehensive Metabolic Panel (CMET)   TSH   Lipid panel   Hemoglobin A1c   Recurrent major depressive disorder, in full remission (HCC)    Chronic, stable No concerns in regards to depression with use of prozac 40 mg Continue to monitor      Relevant Medications   FLUoxetine (PROZAC)  40 MG capsule   Other Relevant Orders   HIV antibody (with reflex)   Hepatitis C Antibody   CBC with Differential/Platelet   Comprehensive Metabolic Panel (CMET)   TSH   Lipid panel   Hemoglobin A1c   Other Visit Diagnoses     Encounter for screening for HIV       Encounter for hepatitis C screening test for low risk patient          Return in about 1 year (around 05/17/2024) for annual examination.     Leilani Merl, FNP, have reviewed all documentation for this visit. The documentation on 05/18/23 for the exam, diagnosis, procedures, and orders are all accurate and complete.  [name], FNP  [facility] [phone] (phone) [phone] (fax)  [facility]

## 2023-05-18 NOTE — Assessment & Plan Note (Signed)
Plans to establish with [facility] midwifery team

## 2023-05-18 NOTE — Assessment & Plan Note (Signed)
Chronic, improved Repeat CBC

## 2023-05-19 LAB — COMPREHENSIVE METABOLIC PANEL
ALT: 15 IU/L (ref 0–32)
AST: 17 IU/L (ref 0–40)
Albumin: 4.5 g/dL (ref 3.8–4.9)
Alkaline Phosphatase: 93 IU/L (ref 44–121)
BUN/Creatinine Ratio: 28 — ABNORMAL HIGH (ref 9–23)
BUN: 24 mg/dL (ref 6–24)
Bilirubin Total: <0.2 mg/dL (ref 0.0–1.2)
CO2: 23 mmol/L (ref 20–29)
Calcium: 10 mg/dL (ref 8.7–10.2)
Chloride: 102 mmol/L (ref 96–106)
Creatinine, Ser: 0.87 mg/dL (ref 0.57–1.00)
Globulin, Total: 2.6 g/dL (ref 1.5–4.5)
Glucose: 84 mg/dL (ref 70–99)
Potassium: 4.6 mmol/L (ref 3.5–5.2)
Sodium: 140 mmol/L (ref 134–144)
Total Protein: 7.1 g/dL (ref 6.0–8.5)
eGFR: 79 mL/min/{1.73_m2} (ref >59)

## 2023-05-19 LAB — CBC WITH DIFFERENTIAL/PLATELET
Basophils Absolute: 0 10*3/uL (ref 0.0–0.2)
Basos: 1 %
EOS (ABSOLUTE): 0.1 10*3/uL (ref 0.0–0.4)
Eos: 1 %
Hematocrit: 38.9 % (ref 34.0–46.6)
Hemoglobin: 13.2 g/dL (ref 11.1–15.9)
Immature Grans (Abs): 0 10*3/uL (ref 0.0–0.1)
Immature Granulocytes: 0 %
Lymphocytes Absolute: 2.4 10*3/uL (ref 0.7–3.1)
Lymphs: 35 %
MCH: 30.7 pg (ref 26.6–33.0)
MCHC: 33.9 g/dL (ref 31.5–35.7)
MCV: 91 fL (ref 79–97)
Monocytes Absolute: 0.5 10*3/uL (ref 0.1–0.9)
Monocytes: 7 %
Neutrophils Absolute: 3.7 10*3/uL (ref 1.4–7.0)
Neutrophils: 56 %
Platelets: 277 10*3/uL (ref 150–450)
RBC: 4.3 x10E6/uL (ref 3.77–5.28)
RDW: 11.8 % (ref 11.7–15.4)
WBC: 6.7 10*3/uL (ref 3.4–10.8)

## 2023-05-19 LAB — HIV ANTIBODY (ROUTINE TESTING W REFLEX): HIV Screen 4th Generation wRfx: NONREACTIVE

## 2023-05-19 LAB — LIPID PANEL
Chol/HDL Ratio: 5 ratio — ABNORMAL HIGH (ref 0.0–4.4)
Cholesterol, Total: 274 mg/dL — ABNORMAL HIGH (ref 100–199)
HDL: 55 mg/dL (ref >39)
LDL Chol Calc (NIH): 199 mg/dL — ABNORMAL HIGH (ref 0–99)
Triglycerides: 114 mg/dL (ref 0–149)
VLDL Cholesterol Cal: 20 mg/dL (ref 5–40)

## 2023-05-19 LAB — TSH: TSH: 2.41 u[IU]/mL (ref 0.450–4.500)

## 2023-05-19 LAB — HEPATITIS C ANTIBODY: Hep C Virus Ab: NONREACTIVE

## 2023-05-19 LAB — HEMOGLOBIN A1C
Est. average glucose Bld gHb Est-mCnc: 123 mg/dL
Hgb A1c MFr Bld: 5.9 % — ABNORMAL HIGH (ref 4.8–5.6)

## 2023-06-20 ENCOUNTER — Ambulatory Visit
Admission: RE | Admit: 2023-06-20 | Discharge: 2023-06-20 | Disposition: A | Discharge status: Home / Self Care | Payer: BC Managed Care – PPO | Source: Ambulatory Visit | Attending: Family Medicine | Admitting: Family Medicine

## 2023-06-20 DIAGNOSIS — Z1231 Encounter for screening mammogram for malignant neoplasm of breast: Secondary | ICD-10-CM | POA: Diagnosis present

## 2023-08-23 ENCOUNTER — Ambulatory Visit: Payer: BC Managed Care – PPO | Admitting: Physician Assistant

## 2023-08-23 ENCOUNTER — Encounter: Payer: Self-pay | Admitting: Physician Assistant

## 2023-08-23 VITALS — BP 122/72 | HR 104 | Temp 99.5°F | Resp 16 | Ht 63.0 in | Wt 165.0 lb

## 2023-08-23 DIAGNOSIS — R110 Nausea: Secondary | ICD-10-CM | POA: Diagnosis not present

## 2023-08-23 DIAGNOSIS — R058 Other specified cough: Secondary | ICD-10-CM | POA: Diagnosis not present

## 2023-08-23 DIAGNOSIS — R509 Fever, unspecified: Secondary | ICD-10-CM

## 2023-08-23 DIAGNOSIS — J9801 Acute bronchospasm: Secondary | ICD-10-CM

## 2023-08-23 DIAGNOSIS — R6889 Other general symptoms and signs: Secondary | ICD-10-CM

## 2023-08-23 LAB — POCT INFLUENZA A/B
Influenza A, POC: NEGATIVE
Influenza B, POC: NEGATIVE

## 2023-08-23 MED ORDER — PREDNISONE 20 MG PO TABS: ORAL_TABLET | ORAL | 0 refills | Status: DC

## 2023-08-23 NOTE — Patient Instructions (Signed)
Based on your described symptoms and the duration of symptoms it is likely that you have a viral upper respiratory infection (often called a "cold")  Symptoms can last for 3-10 days with lingering cough and intermittent symptoms lasting weeks after that.  The goal of treatment at this time is to reduce your symptoms and discomfort   I recommend using Robitussin (regular formulations, nothing with decongestants or DM)  You can also use Tylenol and ibuprofen for body aches and fever reduction  I have sent in a script for Prednisone taper to be taken in the morning with breakfast per the instructions on the container Remember that steroids can cause sleeplessness, irritability, increased hunger and elevated glucose levels so be mindful of these side effects. They should lessen as you progress to the lower doses of the taper.   I also recommend adding an antihistamine to your daily regimen This includes medications like Claritin, Allegra, Zyrtec- the generics of these work very well and are usually less expensive I recommend using Flonase nasal spray - 2 puffs twice per day to help with your nasal congestion The antihistamines and Flonase can take a few weeks to provide significant relief from allergy symptoms but should start to provide some benefit soon. You can use a humidifier at night to help with preventing nasal dryness and irritation   If your symptoms do not improve or become worse in the next 5-7 days please make an apt at the office so we can see you  Go to the ER if you begin to have more serious symptoms such as shortness of breath, trouble breathing, loss of consciousness, swelling around the eyes, high fever, severe lasting headaches, vision changes or neck pain/stiffness.

## 2023-08-23 NOTE — Progress Notes (Signed)
Acute Office Visit   Patient: Allison Beard   DOB: 05-05-1971   52 y.o. Female  MRN: [id number] Visit Date: 08/23/2023  Today's healthcare provider: [name], PA-C  Introduced myself to the patient as a Secondary school teacher and provided education on APPs in clinical practice.    Chief Complaint  Patient presents with   Generalized Body Aches    x3 days   Chills   Cough   Subjective    HPI HPI     Generalized Body Aches    Additional comments: x3 days      Last edited by [name], CMA on 08/23/2023  8:58 AM.       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   metFORMIN (GLUCOPHAGE-XR) 750 MG 24 hr tablet Take 1 tablet (750 mg total) by mouth daily with breakfast.   No facility-administered medications prior to visit.    Review of Systems  Constitutional:  Positive for chills, fatigue and fever (has gotten up to 101).  HENT:  Negative for congestion, ear pain, postnasal drip, rhinorrhea, sinus pressure, sinus pain and sore throat.   Respiratory:  Positive for cough and shortness of breath.   Gastrointestinal:  Positive for nausea. Negative for diarrhea and vomiting.  Musculoskeletal:  Positive for myalgias.  Neurological:  Positive for headaches. Negative for dizziness and light-headedness.         Objective    BP 122/72   Pulse (!) 104   Temp 99.5 F (37.5 C) (Oral)   Resp 16   Ht 5\' 3"  (1.6 m)   Wt 165 lb (74.8 kg)   LMP 01/10/2022   SpO2 95%   BMI 29.23 kg/m       Physical Exam Vitals reviewed.  Constitutional:      General: She is awake.     Appearance: Normal appearance. She is well-developed and well-groomed.  HENT:     Head: Normocephalic and atraumatic.     Right Ear: Hearing and ear canal normal. There is impacted cerumen.     Left Ear: Hearing and ear canal normal. There is impacted cerumen.     Mouth/Throat:     Lips: Pink.     Mouth: Mucous membranes are moist.     Pharynx: Oropharynx is clear. Uvula midline. No pharyngeal swelling, oropharyngeal exudate, posterior oropharyngeal erythema, uvula swelling or postnasal drip.  Cardiovascular:     Rate and Rhythm: Normal rate and regular rhythm.  Pulmonary:     Effort: Pulmonary effort is normal.     Breath sounds: Normal breath sounds. No decreased air movement. No decreased breath sounds, wheezing, rhonchi or rales.  Musculoskeletal:     Cervical back: Normal range of motion and neck supple.  Lymphadenopathy:     Head:     Right side of head: No submental, submandibular or preauricular adenopathy.     Left side of head: No submental, submandibular or preauricular adenopathy.     Cervical: Cervical  adenopathy present.     Right cervical: Superficial cervical adenopathy present.     Left cervical: Superficial cervical adenopathy present.     Upper Body:     Right upper body: No supraclavicular adenopathy.     Left upper body: No supraclavicular adenopathy.  Neurological:     Mental Status: She is alert.  Psychiatric:        Behavior: Behavior is cooperative.       Results for orders placed or performed in visit on 08/23/23  POCT Influenza A/B  Result Value Ref Range   Influenza A, POC Negative Negative   Influenza B, POC Negative Negative    Assessment & Plan      No follow-ups on file.      Problem List Items Addressed This Visit   None Visit Diagnoses     Flu-like symptoms    -  Primary   Relevant Medications   predniSONE (DELTASONE) 20 MG tablet   Other  Relevant Orders   POCT Influenza A/B (Completed)   Novel Coronavirus, NAA ([facility])   Cough due to bronchospasm       Relevant Medications   predniSONE (DELTASONE) 20 MG tablet   Other Relevant Orders   Novel Coronavirus, NAA ([facility])      Acute, new concern Reports ongoing fatigue, fever, chills, and cough since Monday She reports numerous sick contacts recently, HPI and Physical exam are consistent with likely viral URI Will send in script for Prednisone taper to assist with suspected bronchospasm  Recommend alternating tylenol and ibuprofen as needed for fever and body aches Can use OTC medications for cough as well Reviewed ED and return precautions. COVID testing completed- Results to dictate further management  Rapid flu negative Follow up as needed for persistent or progressing symptoms    No follow-ups on file.   I, [name], PA-C, have reviewed all documentation for this visit. The documentation on 08/23/23 for the exam, diagnosis, procedures, and orders are all accurate and complete.   [name], MHS, PA-C [facility]

## 2023-08-23 NOTE — Progress Notes (Signed)
Flu testing was negative.

## 2023-08-25 LAB — NOVEL CORONAVIRUS, NAA: SARS-CoV-2, NAA: NOT DETECTED

## 2023-08-25 LAB — SPECIMEN STATUS REPORT

## 2023-08-25 NOTE — Progress Notes (Signed)
COVID testing was negative.

## 2023-09-11 ENCOUNTER — Encounter: Payer: Self-pay | Admitting: Oncology

## 2024-01-31 ENCOUNTER — Ambulatory Visit: Payer: Self-pay | Admitting: Family Medicine

## 2024-01-31 ENCOUNTER — Encounter: Payer: Self-pay | Admitting: Oncology

## 2024-01-31 ENCOUNTER — Encounter: Payer: Self-pay | Admitting: Family Medicine

## 2024-01-31 VITALS — BP 112/67 | HR 78 | Temp 98.2°F | Ht 63.0 in | Wt 155.0 lb

## 2024-01-31 DIAGNOSIS — Z0001 Encounter for general adult medical examination with abnormal findings: Secondary | ICD-10-CM | POA: Diagnosis not present

## 2024-01-31 DIAGNOSIS — D509 Iron deficiency anemia, unspecified: Secondary | ICD-10-CM

## 2024-01-31 DIAGNOSIS — Z0000 Encounter for general adult medical examination without abnormal findings: Secondary | ICD-10-CM

## 2024-01-31 DIAGNOSIS — Z13220 Encounter for screening for lipoid disorders: Secondary | ICD-10-CM

## 2024-01-31 DIAGNOSIS — Z789 Other specified health status: Secondary | ICD-10-CM

## 2024-01-31 DIAGNOSIS — Z23 Encounter for immunization: Secondary | ICD-10-CM

## 2024-01-31 DIAGNOSIS — R7303 Prediabetes: Secondary | ICD-10-CM | POA: Diagnosis not present

## 2024-01-31 DIAGNOSIS — Z111 Encounter for screening for respiratory tuberculosis: Secondary | ICD-10-CM

## 2024-01-31 DIAGNOSIS — E559 Vitamin D deficiency, unspecified: Secondary | ICD-10-CM | POA: Diagnosis not present

## 2024-01-31 DIAGNOSIS — Z130 Encounter for screening for diseases of the blood and blood-forming organs and certain disorders involving the immune mechanism: Secondary | ICD-10-CM

## 2024-01-31 NOTE — Progress Notes (Signed)
 Complete physical exam   Patient: Allison Beard   DOB: 1971/03/19   53 y.o. Female  MRN: [id number] Visit Date: 01/31/2024  Today's healthcare provider: Carlean Charter, DO   Chief Complaint  Patient presents with   Letter for School/Work    Patient is present today she is needing a form filled out for her to be able work at a school.  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 No other concerns.   Subjective    Allison Beard is a 53 y.o. female who presents today for a complete physical exam.  She reports consuming a general diet. She occasionally walks for exercise. She generally feels well. She reports sleeping well. She does not have additional problems to discuss today.   HPI HPI     Letter for School/Work    Additional comments: Patient is present today she is needing a form filled out for her to be able work at a school.  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 No other concerns.      Last edited by Toombs, Heather  L on 01/31/2024  2:55 PM.       Allison Beard is a 53 year old female who presents for an annual physical exam and to complete a work form for a new job.  She experiences menopausal symptoms, including episodes of feeling 'really hot' and sweating through the sheets, which have persisted for the past couple of years despite being post-menopausal for more than two years. No vaginal bleeding since menopause.  She is currently taking fluoxetine  and metformin , with the latter at a dose of 750 mg once daily. Her last A1c was 5.9, and she was previously informed of a prediabetes diagnosis. She does not follow a specific diet but was restricting carbohydrates and sodium for a period. No numbness, tingling, or fatigue. She engages in daily walks as her primary form of physical activity.  She mentions a history of iron  deficiency for which she received iron  infusions in the fall of 2023. Her iron  levels were fine after the infusions. N Her previous LDL cholesterol was  199. She is concerned about her cholesterol levels.   No vision changes, although her prescription has worsened over time. She reports sleeping well. 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    Past Medical History:  Diagnosis Date   Anemia    Anxiety    Depression    Dizziness    Family history of ovarian cancer    9/21 cancer genetic testing letter sent   Menorrhagia    Pre-diabetes    Uterine fibroid    Vertigo    x1.  1-2 yrs ago   Past Surgical History:  Procedure Laterality Date   BREAST BIOPSY Left 2015   neg-, neddle bx/clip and bx   COLONOSCOPY WITH PROPOFOL  N/A 09/23/2022   Procedure: COLONOSCOPY WITH PROPOFOL ;  Surgeon: Marnee Sink, MD;  Location: [facility];  Service: Endoscopy;  Laterality: N/A;   ESOPHAGOGASTRODUODENOSCOPY (EGD) WITH PROPOFOL  N/A 09/23/2022   Procedure: ESOPHAGOGASTRODUODENOSCOPY (EGD) WITH PROPOFOL ;  Surgeon: Marnee Sink, MD;  Location: [facility];  Service: Endoscopy;  Laterality: N/A;   HYSTEROSCOPY WITH D & C  2008   endometrial polyp   MM BREAST STEREO BIOPSY LEFT ([facility] HX)  04/26/2013   ORIF ANKLE FRACTURE Right 1989   POLYPECTOMY  09/23/2022   Procedure: POLYPECTOMY;  Surgeon: Marnee Sink,  MD;  Location: [facility];  Service: Endoscopy;;   WISDOM TOOTH EXTRACTION     Social History   Socioeconomic History   Marital status: Married    Spouse name: Not on file   Number of children: 3   Years of education: 16   Highest education level: Bachelor's degree (e.g., BA, AB, BS)  Occupational History   Occupation: Runner, broadcasting/film/video  Tobacco Use   Smoking status: Never   Smokeless tobacco: Never  Vaping Use   Vaping status: Never Used  Substance and Sexual Activity   Alcohol use: Yes    Alcohol/week: 0.0 standard drinks of alcohol    Comment: OCCASIONALLY   Drug use: No   Sexual activity: Yes    Partners: Male    Birth  control/protection: None  Other Topics Concern   Not on file  Social History Narrative   Not on file   Social Drivers of Health   Financial Resource Strain: Low Risk  (05/17/2023)   Overall Financial Resource Strain (CARDIA)    Difficulty of Paying Living Expenses: Not very hard  Food Insecurity: No Food Insecurity (05/17/2023)   Hunger Vital Sign    Worried About Running Out of Food in the Last Year: Never true    Ran Out of Food in the Last Year: Never true  Transportation Needs: No Transportation Needs (05/17/2023)   PRAPARE - Administrator, Civil Service (Medical): No    Lack of Transportation (Non-Medical): No  Physical Activity: Insufficiently Active (05/17/2023)   Exercise Vital Sign    Days of Exercise per Week: 4 days    Minutes of Exercise per Session: 20 min  Stress: No Stress Concern Present (05/17/2023)   [employer] of Occupational Health - Occupational Stress Questionnaire    Feeling of Stress : Only a little  Social Connections: Moderately Integrated (05/17/2023)   Social Connection and Isolation Panel [NHANES]    Frequency of Communication with Friends and Family: Once a week    Frequency of Social Gatherings with Friends and Family: Once a week    Attends Religious Services: More than 4 times per year    Active Member of [employer] or Organizations: Yes    Attends Engineer, structural: More than 4 times per year    Marital Status: Married  Catering manager Violence: Not on file   Family Status  Relation Name Status   Mother  Deceased at age 48   Father  Deceased   Sister  Chemical engineer  (Not Specified)   Youth worker  (Not Specified)   Mat Uncle two uncles (Not Specified)   Mat Aunt  (Not Specified)   Neg Hx  (Not Specified)  No partnership data on file   Family History  Problem Relation Age of Onset   Depression Mother    Diabetes Mother    Bipolar disorder Mother    Heart disease Mother    Hypertension Mother    COPD Mother     Psychiatric Illness Father    Bipolar disorder Sister    Lung cancer Maternal Aunt    Lung cancer Maternal Aunt    Heart disease Maternal Uncle    Ovarian cancer Maternal Aunt 25   Breast cancer Neg Hx    No Known Allergies  Patient Care Team: Romonda Parker, Asencion Blacksmith, DO as PCP - General (Family Medicine)   Medications: Outpatient Medications Prior to Visit  Medication Sig   FLUoxetine  (PROZAC ) 40 MG capsule Take 1  capsule (40 mg total) by mouth daily.   metFORMIN  (GLUCOPHAGE -XR) 750 MG 24 hr tablet Take 1 tablet (750 mg total) by mouth daily with breakfast.   [DISCONTINUED] predniSONE  (DELTASONE ) 20 MG tablet Take 60mg  PO daily x 2 days, then40mg  PO daily x 2 days, then 20mg  PO daily x 3 days (Patient not taking: Reported on 01/31/2024)   No facility-administered medications prior to visit.    Review of Systems  Constitutional:  Negative for chills, fatigue and fever.  HENT:  Negative for congestion, ear pain, rhinorrhea, sneezing and sore throat.   Eyes: Negative.  Negative for pain and redness.  Respiratory:  Negative for cough, shortness of breath and wheezing.   Cardiovascular:  Negative for chest pain and leg swelling.  Gastrointestinal:  Negative for abdominal pain, blood in stool, constipation, diarrhea and nausea.  Endocrine: Negative for polydipsia and polyphagia.  Genitourinary: Negative.  Negative for dysuria, flank pain, hematuria, pelvic pain, vaginal bleeding and vaginal discharge.  Musculoskeletal:  Negative for arthralgias, back pain, gait problem and joint swelling.  Skin:  Negative for rash.  Neurological: Negative.  Negative for dizziness, tremors, seizures, weakness, light-headedness, numbness and headaches.  Hematological:  Negative for adenopathy.  Psychiatric/Behavioral: Negative.  Negative for behavioral problems, confusion and dysphoric mood. The patient is not nervous/anxious and is not hyperactive.       Objective    BP 112/67 (BP Location: Right Arm,  Patient Position: Sitting, Cuff Size: Normal)   Pulse 78   Temp 98.2 F (36.8 C) (Oral)   Ht 5\' 3"  (1.6 m)   Wt 155 lb (70.3 kg)   LMP 01/10/2022   SpO2 97%   BMI 27.46 kg/m    Physical Exam Vitals and nursing note reviewed.  Constitutional:      General: She is awake.     Appearance: Normal appearance.  HENT:     Head: Normocephalic and atraumatic.     Right Ear: Tympanic membrane, ear canal and external ear normal.     Left Ear: Tympanic membrane, ear canal and external ear normal.     Nose: Nose normal.     Mouth/Throat:     Mouth: Mucous membranes are moist.     Pharynx: Oropharynx is clear. No oropharyngeal exudate or posterior oropharyngeal erythema.  Eyes:     General: No scleral icterus.    Extraocular Movements: Extraocular movements intact.     Conjunctiva/sclera: Conjunctivae normal.     Pupils: Pupils are equal, round, and reactive to light.  Neck:     Thyroid: No thyromegaly or thyroid tenderness.  Cardiovascular:     Rate and Rhythm: Normal rate and regular rhythm.     Pulses: Normal pulses.     Heart sounds: Normal heart sounds.  Pulmonary:     Effort: Pulmonary effort is normal. No tachypnea, bradypnea or respiratory distress.     Breath sounds: Normal breath sounds. No stridor. No wheezing, rhonchi or rales.  Abdominal:     General: Bowel sounds are normal. There is no distension.     Palpations: Abdomen is soft. There is no mass.     Tenderness: There is no abdominal tenderness. There is no guarding.     Hernia: No hernia is present.  Musculoskeletal:     Cervical back: Normal range of motion and neck supple.     Right lower leg: No edema.     Left lower leg: No edema.  Lymphadenopathy:     Cervical: No cervical adenopathy.  Skin:  General: Skin is warm and dry.  Neurological:     Mental Status: She is alert and oriented to person, place, and time. Mental status is at baseline.  Psychiatric:        Mood and Affect: Mood normal.         Behavior: Behavior normal.       Last depression screening scores    01/31/2024    2:56 PM 08/23/2023    8:50 AM 05/18/2023    4:02 PM  PHQ 2/9 Scores  PHQ - 2 Score 1 0 0  PHQ- 9 Score 4 0    Last fall risk screening    01/31/2024    2:56 PM  Fall Risk   Falls in the past year? 0  Number falls in past yr: 0  Injury with Fall? 0   Last Audit-C alcohol use screening    05/17/2023    4:38 PM  Alcohol Use Disorder Test (AUDIT)  1. How often do you have a drink containing alcohol? 1  2. How many drinks containing alcohol do you have on a typical day when you are drinking? 0  3. How often do you have six or more drinks on one occasion? 1  AUDIT-C Score 2      Patient-reported   A score of 3 or more in women, and 4 or more in men indicates increased risk for alcohol abuse, EXCEPT if all of the points are from question 1   Results for orders placed or performed in visit on 01/31/24  Comprehensive metabolic panel with GFR  Result Value Ref Range   Glucose 88 70 - 99 mg/dL   BUN 25 (H) 6 - 24 mg/dL   Creatinine, Ser 1.91 0.57 - 1.00 mg/dL   eGFR 95 >47 WG/NFA/2.13   BUN/Creatinine Ratio 34 (H) 9 - 23   Sodium 141 134 - 144 mmol/L   Potassium 4.5 3.5 - 5.2 mmol/L   Chloride 101 96 - 106 mmol/L   CO2 24 20 - 29 mmol/L   Calcium 10.0 8.7 - 10.2 mg/dL   Total Protein 7.0 6.0 - 8.5 g/dL   Albumin 4.5 3.8 - 4.9 g/dL   Globulin, Total 2.5 1.5 - 4.5 g/dL   Bilirubin Total <0.8 0.0 - 1.2 mg/dL   Alkaline Phosphatase 96 44 - 121 IU/L   AST 14 0 - 40 IU/L   ALT 13 0 - 32 IU/L  Hemoglobin A1c  Result Value Ref Range   Hgb A1c MFr Bld 6.0 (H) 4.8 - 5.6 %   Est. average glucose Bld gHb Est-mCnc 126 mg/dL  Lipid panel  Result Value Ref Range   Cholesterol, Total 293 (H) 100 - 199 mg/dL   Triglycerides 657 (H) 0 - 149 mg/dL   HDL 49 >84 mg/dL   VLDL Cholesterol Cal 54 (H) 5 - 40 mg/dL   LDL Chol Calc (NIH) 696 (H) 0 - 99 mg/dL   LDL CALC COMMENT: Comment    Chol/HDL Ratio 6.0  (H) 0.0 - 4.4 ratio  VITAMIN D  25 Hydroxy (Vit-D Deficiency, Fractures)  Result Value Ref Range   Vit D, 25-Hydroxy 23.1 (L) 30.0 - 100.0 ng/mL  Iron , TIBC and Ferritin Panel  Result Value Ref Range   Total Iron  Binding Capacity 324 250 - 450 ug/dL   UIBC 295 284 - 132 ug/dL   Iron  73 27 - 159 ug/dL   Iron  Saturation 23 15 - 55 %   Ferritin 76 15 - 150 ng/mL  CBC  Result Value Ref Range   WBC 6.2 3.4 - 10.8 x10E3/uL   RBC 4.26 3.77 - 5.28 x10E6/uL   Hemoglobin 12.6 11.1 - 15.9 g/dL   Hematocrit 16.1 09.6 - 46.6 %   MCV 90 79 - 97 fL   MCH 29.6 26.6 - 33.0 pg   MCHC 32.7 31.5 - 35.7 g/dL   RDW 04.5 40.9 - 81.1 %   Platelets 271 150 - 450 x10E3/uL  Hepatitis B Surface AntiBODY  Result Value Ref Range   Hep B Surface Ab, Qual Non Reactive   Measles/Mumps/Rubella Immunity  Result Value Ref Range   Rubella Antibodies, IGG 4.24 Immune >0.99 index   RUBEOLA AB, IGG 16.8 Immune >16.4 AU/mL   MUMPS ABS, IGG 51.1 Immune >10.9 AU/mL  QuantiFERON-TB Gold Plus  Result Value Ref Range   QuantiFERON Incubation Incubation performed.    QuantiFERON Criteria Comment    QuantiFERON TB1 Ag Value 0.05 IU/mL   QuantiFERON TB2 Ag Value 0.05 IU/mL   QuantiFERON Nil Value 0.05 IU/mL   QuantiFERON Mitogen Value >10.00 IU/mL   QuantiFERON-TB Gold Plus Negative Negative    Assessment & Plan    Routine Health Maintenance and Physical Exam  Exercise Activities and Dietary recommendations  Goals   None     Immunization History  Administered Date(s) Administered   PFIZER Comirnaty(Gray Top)Covid-19 Tri-Sucrose Vaccine 11/09/2019, 12/10/2019   PNEUMOCOCCAL CONJUGATE-20 01/31/2024   Td 01/31/2024   Tdap 07/26/2011   Zoster Recombinant(Shingrix ) 04/15/2022, 06/24/2022    Health Maintenance  Topic Date Due   Cervical Cancer Screening (HPV/Pap Cotest)  02/15/2021   COVID-19 Vaccine (3 - 2024-25 season) 05/14/2023   INFLUENZA VACCINE  04/12/2024   MAMMOGRAM  06/19/2025   Colonoscopy   09/23/2032   DTaP/Tdap/Td (3 - Td or Tdap) 01/30/2034   Hepatitis C Screening  Completed   HIV Screening  Completed   Zoster Vaccines- Shingrix   Completed   Pneumococcal Vaccine 29-35 Years old  Aged Out   HPV VACCINES  Aged Out   Meningococcal B Vaccine  Aged Out    Discussed health benefits of physical activity, and encouraged her to engage in regular exercise appropriate for her age and condition.   Annual physical exam  Vitamin D  deficiency, unspecified -     VITAMIN D  25 Hydroxy (Vit-D Deficiency, Fractures)  Iron  deficiency anemia, unspecified iron  deficiency anemia type -     Iron , TIBC and Ferritin Panel -     CBC  Prediabetes -     Hemoglobin A1c  Screening for lipid disorders -     Lipid panel  Screening for endocrine, metabolic and immunity disorder -     Comprehensive metabolic panel with GFR  Encounter for Prevnar pneumococcal vaccination -     Pneumococcal conjugate vaccine 20-valent  Need for Td vaccine -     Td vaccine greater than or equal to 7yo preservative free IM  Tuberculosis screening -     QuantiFERON-TB Gold Plus  Measles, mumps, rubella (MMR) vaccination status unknown -     Measles/Mumps/Rubella Immunity  Hepatitis B vaccination status unknown -     Hepatitis B surface antibody,qualitative       Annual physical exam Physical exam overall unremarkable except as noted above. Routine lab work ordered as noted.  Due for tetanus vaccine. Discussed checking hepatitis B immunity through titers. Recommended pneumonia vaccine. - Administer tetanus vaccine. - Consider titers to check immunity for hepatitis B. - Recommend pneumonia vaccine.  Prediabetes A1c is 5.9,  indicating prediabetes. Currently on metformin  750 mg once daily. Emphasized importance of monitoring blood glucose and lifestyle modifications. - Order blood work to check A1c and other relevant parameters.  Hyperlipidemia LDL cholesterol elevated at 199. ASCVD risk score low  at 1.8%. Discussed potential need for medication if LDL exceeds 180, especially with diabetes diagnosis. Emphasized diet and exercise. - Order blood work to check cholesterol levels.  Iron  deficiency anemia Received iron  infusions in fall 2023. No current symptoms. Will check iron  levels for stability. - Order blood work to check iron  levels.  Menopausal symptoms Experiencing symptoms for years, including hot flashes and night sweats. Currently on fluoxetine .    Return in about 1 year (around 01/30/2025) for CPE.     I discussed the assessment and treatment plan with the patient  The patient was provided an opportunity to ask questions and all were answered. The patient agreed with the plan and demonstrated an understanding of the instructions.   The patient was advised to call back or seek an in-person evaluation if the symptoms worsen or if the condition fails to improve as anticipated.    Carlean Charter, DO  [facility] [phone] (phone) [phone] (fax)  [facility]

## 2024-02-03 LAB — COMPREHENSIVE METABOLIC PANEL WITH GFR
ALT: 13 IU/L (ref 0–32)
AST: 14 IU/L (ref 0–40)
Albumin: 4.5 g/dL (ref 3.8–4.9)
Alkaline Phosphatase: 96 IU/L (ref 44–121)
BUN/Creatinine Ratio: 34 — ABNORMAL HIGH (ref 9–23)
BUN: 25 mg/dL — ABNORMAL HIGH (ref 6–24)
Bilirubin Total: <0.2 mg/dL (ref 0.0–1.2)
CO2: 24 mmol/L (ref 20–29)
Calcium: 10 mg/dL (ref 8.7–10.2)
Chloride: 101 mmol/L (ref 96–106)
Creatinine, Ser: 0.74 mg/dL (ref 0.57–1.00)
Globulin, Total: 2.5 g/dL (ref 1.5–4.5)
Glucose: 88 mg/dL (ref 70–99)
Potassium: 4.5 mmol/L (ref 3.5–5.2)
Sodium: 141 mmol/L (ref 134–144)
Total Protein: 7 g/dL (ref 6.0–8.5)
eGFR: 95 mL/min/{1.73_m2} (ref >59)

## 2024-02-03 LAB — CBC
Hematocrit: 38.5 % (ref 34.0–46.6)
Hemoglobin: 12.6 g/dL (ref 11.1–15.9)
MCH: 29.6 pg (ref 26.6–33.0)
MCHC: 32.7 g/dL (ref 31.5–35.7)
MCV: 90 fL (ref 79–97)
Platelets: 271 10*3/uL (ref 150–450)
RBC: 4.26 x10E6/uL (ref 3.77–5.28)
RDW: 12.3 % (ref 11.7–15.4)
WBC: 6.2 10*3/uL (ref 3.4–10.8)

## 2024-02-03 LAB — QUANTIFERON-TB GOLD PLUS
QuantiFERON Mitogen Value: >10 [IU]/mL
QuantiFERON Nil Value: 0.05 [IU]/mL
QuantiFERON TB1 Ag Value: 0.05 [IU]/mL
QuantiFERON TB2 Ag Value: 0.05 [IU]/mL
QuantiFERON-TB Gold Plus: NEGATIVE

## 2024-02-03 LAB — LIPID PANEL
Chol/HDL Ratio: 6 ratio — ABNORMAL HIGH (ref 0.0–4.4)
Cholesterol, Total: 293 mg/dL — ABNORMAL HIGH (ref 100–199)
HDL: 49 mg/dL (ref >39)
LDL Chol Calc (NIH): 190 mg/dL — ABNORMAL HIGH (ref 0–99)
Triglycerides: 278 mg/dL — ABNORMAL HIGH (ref 0–149)
VLDL Cholesterol Cal: 54 mg/dL — ABNORMAL HIGH (ref 5–40)

## 2024-02-03 LAB — MEASLES/MUMPS/RUBELLA IMMUNITY
MUMPS ABS, IGG: 51.1 [AU]/ml (ref >10.9)
RUBEOLA AB, IGG: 16.8 [AU]/ml (ref >16.4)
Rubella Antibodies, IGG: 4.24 {index} (ref >0.99)

## 2024-02-03 LAB — HEPATITIS B SURFACE ANTIBODY,QUALITATIVE: Hep B Surface Ab, Qual: NONREACTIVE

## 2024-02-03 LAB — IRON,TIBC AND FERRITIN PANEL
Ferritin: 76 ng/mL (ref 15–150)
Iron Saturation: 23 % (ref 15–55)
Iron: 73 ug/dL (ref 27–159)
Total Iron Binding Capacity: 324 ug/dL (ref 250–450)
UIBC: 251 ug/dL (ref 131–425)

## 2024-02-03 LAB — VITAMIN D 25 HYDROXY (VIT D DEFICIENCY, FRACTURES): Vit D, 25-Hydroxy: 23.1 ng/mL — ABNORMAL LOW (ref 30.0–100.0)

## 2024-02-03 LAB — HEMOGLOBIN A1C
Est. average glucose Bld gHb Est-mCnc: 126 mg/dL
Hgb A1c MFr Bld: 6 % — ABNORMAL HIGH (ref 4.8–5.6)

## 2024-02-07 ENCOUNTER — Telehealth: Payer: Self-pay

## 2024-02-07 NOTE — Telephone Encounter (Signed)
 Copied from CRM [id number]. Topic: General - Call Back - No Documentation >> Feb 06, 2024  4:36 PM Stanly Early wrote: Reason for CRM: patient called to informed there was a form she gave the provider during her appointment on 5/21. I called the call  the cal and spoke with veronica to see if the form was ready. She stated that there was no form but the patient told me she left it with the provider. Please give her a callback [phone]

## 2024-02-07 NOTE — Telephone Encounter (Signed)
 Called and spoke to the pt, to inform her the forms has been completed and ready for pickup

## 2024-02-07 NOTE — Telephone Encounter (Signed)
 Form completed and signed. Left at my workstation

## 2024-02-18 ENCOUNTER — Encounter: Payer: Self-pay | Admitting: Family Medicine

## 2024-05-14 ENCOUNTER — Other Ambulatory Visit: Payer: Self-pay

## 2024-05-14 ENCOUNTER — Telehealth: Payer: Self-pay | Admitting: Family Medicine

## 2024-05-14 DIAGNOSIS — R7303 Prediabetes: Secondary | ICD-10-CM

## 2024-05-14 MED ORDER — METFORMIN HCL ER 750 MG PO TB24: 750.0000 mg | ORAL_TABLET | Freq: Every day | ORAL | 2 refills | Status: AC

## 2024-05-14 NOTE — Telephone Encounter (Addendum)
 [facility] faxed refill request for the following medications:  metFORMIN  (GLUCOPHAGE -XR) 750 MG 24 hr tablet   FLUoxetine  (PROZAC ) 40 MG capsule    Please advise.

## 2024-05-14 NOTE — Telephone Encounter (Signed)
 Medication has been sent over to pharmacy.

## 2024-05-15 ENCOUNTER — Telehealth: Payer: Self-pay | Admitting: Family Medicine

## 2024-05-15 DIAGNOSIS — F3342 Major depressive disorder, recurrent, in full remission: Secondary | ICD-10-CM

## 2024-05-15 DIAGNOSIS — F411 Generalized anxiety disorder: Secondary | ICD-10-CM

## 2024-05-15 MED ORDER — FLUOXETINE HCL 40 MG PO CAPS: 40.0000 mg | ORAL_CAPSULE | Freq: Every day | ORAL | 3 refills | Status: AC

## 2024-05-15 NOTE — Telephone Encounter (Signed)
[facility] faxed refill request for the following medications: ° °FLUoxetine (PROZAC) 40 MG capsule ° ° °Please advise. °

## 2024-05-17 ENCOUNTER — Other Ambulatory Visit: Payer: Self-pay | Admitting: Family Medicine

## 2024-05-17 DIAGNOSIS — F3342 Major depressive disorder, recurrent, in full remission: Secondary | ICD-10-CM

## 2024-05-17 DIAGNOSIS — F411 Generalized anxiety disorder: Secondary | ICD-10-CM

## 2024-05-17 NOTE — Telephone Encounter (Signed)
 Copied from CRM #[id number]. Topic: Clinical - Medication Refill >> May 17, 2024  1:00 PM Fonda T wrote: Medication: FLUoxetine  (PROZAC ) 40 MG capsule  Has the patient contacted their pharmacy? Yes, advised to contact office.    This is the patient's preferred pharmacy:  [id number] - [place], [place] - [address] AT [facility] OF [address] [place] [address] [place] [address] Phone: [phone] Fax: [phone]  Is this the correct pharmacy for this prescription? Yes If no, delete pharmacy and type the correct one.   Has the prescription been filled recently? Yes  Is the patient out of the medication? No, have 2 tablets left  Has the patient been seen for an appointment in the last year OR does the patient have an upcoming appointment? Yes, last seen 01/31/24  Can we respond through MyChart? No, prefers phone call   Agent: Please be advised that Rx refills may take up to 3 business days. We ask that you follow-up with your pharmacy.

## 2024-05-17 NOTE — Telephone Encounter (Signed)
 Duplicate request, [facility] 05/15/24.  Requested Prescriptions  Pending Prescriptions Disp Refills   FLUoxetine  (PROZAC ) 40 MG capsule 90 capsule 3    Sig: Take 1 capsule (40 mg total) by mouth daily.     Psychiatry:  Antidepressants - SSRI Passed - 05/17/2024  4:14 PM      Passed - Completed PHQ-2 or PHQ-9 in the last 360 days      Passed - Valid encounter within last 6 months    Recent Outpatient Visits           3 months ago Annual physical exam   [facility], Lauraine SAILOR, DO

## 2024-05-21 ENCOUNTER — Encounter: Payer: Self-pay | Admitting: Family Medicine

## 2024-05-21 ENCOUNTER — Telehealth: Payer: Self-pay

## 2024-05-21 ENCOUNTER — Other Ambulatory Visit: Payer: Self-pay | Admitting: Family Medicine

## 2024-05-21 DIAGNOSIS — Z1239 Encounter for other screening for malignant neoplasm of breast: Secondary | ICD-10-CM

## 2024-05-21 DIAGNOSIS — Z809 Family history of malignant neoplasm, unspecified: Secondary | ICD-10-CM

## 2024-05-21 NOTE — Telephone Encounter (Signed)
 Copied from CRM #[id number]. Topic: Clinical - Request for Lab/Test Order >> May 21, 2024  3:19 PM Everette C wrote: Reason for CRM: The patient has called to request for an annual mammogram to be submitted to [facility]

## 2024-06-25 ENCOUNTER — Ambulatory Visit

## 2024-07-29 ENCOUNTER — Ambulatory Visit
Admission: RE | Admit: 2024-07-29 | Discharge: 2024-07-29 | Disposition: A | Discharge status: Home / Self Care | Source: Ambulatory Visit | Attending: Family Medicine | Admitting: Family Medicine

## 2024-07-29 DIAGNOSIS — Z1239 Encounter for other screening for malignant neoplasm of breast: Secondary | ICD-10-CM

## 2024-07-29 DIAGNOSIS — Z1231 Encounter for screening mammogram for malignant neoplasm of breast: Secondary | ICD-10-CM | POA: Insufficient documentation

## 2024-07-29 DIAGNOSIS — Z809 Family history of malignant neoplasm, unspecified: Secondary | ICD-10-CM | POA: Insufficient documentation

## 2024-08-12 ENCOUNTER — Ambulatory Visit: Payer: Self-pay | Admitting: Family Medicine

## 2024-10-10 ENCOUNTER — Ambulatory Visit: Admitting: Certified Nurse Midwife

## 2024-10-10 ENCOUNTER — Encounter: Payer: Self-pay | Admitting: Certified Nurse Midwife

## 2024-10-10 ENCOUNTER — Other Ambulatory Visit (HOSPITAL_COMMUNITY)
Admission: RE | Admit: 2024-10-10 | Discharge: 2024-10-10 | Disposition: A | Source: Ambulatory Visit | Attending: Certified Nurse Midwife | Admitting: Certified Nurse Midwife

## 2024-10-10 VITALS — BP 121/68 | HR 74 | Resp 16 | Ht 63.0 in | Wt 175.1 lb

## 2024-10-10 DIAGNOSIS — Z124 Encounter for screening for malignant neoplasm of cervix: Secondary | ICD-10-CM | POA: Diagnosis present

## 2024-10-10 DIAGNOSIS — Z01419 Encounter for gynecological examination (general) (routine) without abnormal findings: Secondary | ICD-10-CM | POA: Diagnosis present

## 2024-10-10 NOTE — Progress Notes (Signed)
 "  ANNUAL PREVENTATIVE CARE GYNECOLOGY  ENCOUNTER NOTE  Subjective:       Allison Beard is a 54 y.o. [phone] female here for a routine annual gynecologic exam. The patient is sexually active. The patient is not taking hormone replacement therapy. Patient denies post-menopausal vaginal bleeding. The patient wears seatbelts: yes. The patient participates in regular exercise: no. Has the patient ever been transfused or tattooed?: no. The patient reports that there is not domestic violence in her life.  Current complaints: 1.  Lower libido    Gynecologic History Patient's last menstrual period was 01/10/2022. Contraception: post menopausal status Last Pap: 02/15/2018. Results were: normal no HPV was done Last mammogram: 07/29/2024. Results were: normal Last Colonoscopy: 09/23/2022: 10 years Last Dexa Scan: Not age appropriate   Obstetric History OB History  Gravida Para Term Preterm AB Living  4 3 3  1 3   SAB IAB Ectopic Multiple Live Births  1    3    # Outcome Date GA Lbr Len/2nd Weight Sex Type Anes PTL Lv  4 Term 10/09/05 [redacted]w[redacted]d  8 lb 5 oz (3.771 kg) F Vag-Spont   LIV  3 Term 05/24/96 [redacted]w[redacted]d  8 lb 5 oz (3.771 kg) M Vag-Spont   LIV  2 Term 12/19/93 [redacted]w[redacted]d  7 lb 14 oz (3.572 kg) M Vag-Vacuum   LIV  1 SAB             Past Medical History:  Diagnosis Date   Anemia    Anxiety    Depression    Dizziness    Family history of ovarian cancer    9/21 cancer genetic testing letter sent   Menorrhagia    Pre-diabetes    Uterine fibroid    Vertigo    x1.  1-2 yrs ago    Family History  Problem Relation Age of Onset   Depression Mother    Diabetes Mother    Bipolar disorder Mother    Heart disease Mother    Hypertension Mother    COPD Mother    Psychiatric Illness Father    Bipolar disorder Sister    Lung cancer Maternal Aunt    Lung cancer Maternal Aunt    Heart disease Maternal Uncle    Ovarian cancer Maternal Aunt 18   Breast cancer Neg Hx     Past Surgical  History:  Procedure Laterality Date   BREAST BIOPSY Left 2015   neg-, neddle bx/clip and bx   COLONOSCOPY WITH PROPOFOL  N/A 09/23/2022   Procedure: COLONOSCOPY WITH PROPOFOL ;  Surgeon: Jinny Carmine, MD;  Location: [facility];  Service: Endoscopy;  Laterality: N/A;   ESOPHAGOGASTRODUODENOSCOPY (EGD) WITH PROPOFOL  N/A 09/23/2022   Procedure: ESOPHAGOGASTRODUODENOSCOPY (EGD) WITH PROPOFOL ;  Surgeon: Jinny Carmine, MD;  Location: [facility];  Service: Endoscopy;  Laterality: N/A;   HYSTEROSCOPY WITH D & C  2008   endometrial polyp   MM BREAST STEREO BIOPSY LEFT ([facility] HX)  04/26/2013   ORIF ANKLE FRACTURE Right 1989   POLYPECTOMY  09/23/2022   Procedure: POLYPECTOMY;  Surgeon: Jinny Carmine, MD;  Location: [facility];  Service: Endoscopy;;   WISDOM TOOTH EXTRACTION      Social History   Socioeconomic History   Marital status: Married    Spouse name: Not on file   Number of children: 3   Years of education: 16   Highest education level: Bachelor's degree (e.g., BA, AB, BS)  Occupational History   Occupation: runner, broadcasting/film/video  Tobacco Use  Smoking status: Never   Smokeless tobacco: Never  Vaping Use   Vaping status: Never Used  Substance and Sexual Activity   Alcohol use: Yes    Alcohol/week: 0.0 standard drinks of alcohol    Comment: OCCASIONALLY   Drug use: No   Sexual activity: Yes    Partners: Male    Birth control/protection: None  Other Topics Concern   Not on file  Social History Narrative   Not on file   Social Drivers of Health   Tobacco Use: Low Risk (10/10/2024)   Patient History    Smoking Tobacco Use: Never    Smokeless Tobacco Use: Never    Passive Exposure: Not on file  Financial Resource Strain: Low Risk (05/17/2023)   Overall Financial Resource Strain (CARDIA)    Difficulty of Paying Living Expenses: Not very hard  Food Insecurity: No Food Insecurity (05/17/2023)   Hunger Vital Sign    Worried About Running Out of Food in the Last Year:  Never true    Ran Out of Food in the Last Year: Never true  Transportation Needs: No Transportation Needs (05/17/2023)   PRAPARE - Administrator, Civil Service (Medical): No    Lack of Transportation (Non-Medical): No  Physical Activity: Insufficiently Active (05/17/2023)   Exercise Vital Sign    Days of Exercise per Week: 4 days    Minutes of Exercise per Session: 20 min  Stress: No Stress Concern Present (05/17/2023)   [employer] of Occupational Health - Occupational Stress Questionnaire    Feeling of Stress : Only a little  Social Connections: Moderately Integrated (05/17/2023)   Social Connection and Isolation Panel    Frequency of Communication with Friends and Family: Once a week    Frequency of Social Gatherings with Friends and Family: Once a week    Attends Religious Services: More than 4 times per year    Active Member of [employer] or Organizations: Yes    Attends Banker Meetings: More than 4 times per year    Marital Status: Married  Catering Manager Violence: Not on file  Depression (PHQ2-9): Low Risk (10/10/2024)   Depression (PHQ2-9)    PHQ-2 Score: 0  Alcohol Screen: Low Risk (05/17/2023)   Alcohol Screen    Last Alcohol Screening Score (AUDIT): 2  Housing: Low Risk (05/17/2023)   Housing    Last Housing Risk Score: 0  Utilities: Not on file  Health Literacy: Not on file    Medications Ordered Prior to Encounter[1]  Allergies[2]    Review of Systems ROS Review of Systems - General ROS: negative for - chills, fatigue, fever, hot flashes, night sweats, weight gain or weight loss Psychological ROS: negative for - anxiety, decreased libido, depression, mood swings, physical abuse or sexual abuse Ophthalmic ROS: negative for - blurry vision, eye pain or loss of vision ENT ROS: negative for - headaches, hearing change, visual changes or vocal changes Allergy and Immunology ROS: negative for - hives, itchy/watery eyes or seasonal  allergies Hematological and Lymphatic ROS: negative for - bleeding problems, bruising, swollen lymph nodes or weight loss Endocrine ROS: negative for - galactorrhea, hair pattern changes, hot flashes, malaise/lethargy, mood swings, palpitations, polydipsia/polyuria, skin changes, temperature intolerance or unexpected weight changes Breast ROS: negative for - new or changing breast lumps or nipple discharge Respiratory ROS: negative for - cough or shortness of breath Cardiovascular ROS: negative for - chest pain, irregular heartbeat, palpitations or shortness of breath Gastrointestinal ROS: no abdominal pain, change  in bowel habits, or black or bloody stools Genito-Urinary ROS: no dysuria, trouble voiding, or hematuria Musculoskeletal ROS: negative for - joint pain or joint stiffness Neurological ROS: negative for - bowel and bladder control changes Dermatological ROS: negative for rash and skin lesion changes   Objective:   BP 121/68   Pulse 74   Resp 16   Ht 5' 3 (1.6 m)   Wt 175 lb 1.6 oz (79.4 kg)   LMP 01/10/2022   BMI 31.02 kg/m  CONSTITUTIONAL: Well-developed, well-nourished female in no acute distress.  PSYCHIATRIC: Normal mood and affect. Normal behavior. Normal judgment and thought content. NEUROLGIC: Alert and oriented to person, place, and time. Normal muscle tone coordination. No cranial nerve deficit noted. NECK: Normal range of motion, supple, no masses.  Normal thyroid.  CARDIOVASCULAR: Normal heart rate noted, regular rhythm, no murmur. RESPIRATORY: Clear to auscultation bilaterally. Effort and breath sounds normal, no problems with respiration noted. BREASTS: Symmetric in size. No masses, skin changes, nipple drainage, or lymphadenopathy. ABDOMEN: Soft, normal bowel sounds, no distention noted.  No tenderness, rebound or guarding.  BLADDER: Normal PELVIC:  Bladder no bladder distension noted  Urethra: not indicated  Vulva: normal appearing vulva with no masses,  tenderness or lesions  Vagina: normal appearing vagina with normal color and discharge, no lesions  Cervix: normal appearing cervix without discharge or lesions  Uterus: uterus is normal size, shape, consistency and nontender  Adnexa: normal adnexa in size, nontender and no masses  RV: External Exam NormaI, No Rectal Masses, and Normal Sphincter tone     Labs: Lab Results  Component Value Date   WBC 6.2 01/31/2024   HGB 12.6 01/31/2024   HCT 38.5 01/31/2024   MCV 90 01/31/2024   PLT 271 01/31/2024    Lab Results  Component Value Date   CREATININE 0.74 01/31/2024   BUN 25 (H) 01/31/2024   NA 141 01/31/2024   K 4.5 01/31/2024   CL 101 01/31/2024   CO2 24 01/31/2024    Lab Results  Component Value Date   ALT 13 01/31/2024   AST 14 01/31/2024   ALKPHOS 96 01/31/2024   BILITOT <0.2 01/31/2024    Lab Results  Component Value Date   CHOL 293 (H) 01/31/2024   HDL 49 01/31/2024   LDLCALC 190 (H) 01/31/2024   TRIG 278 (H) 01/31/2024   CHOLHDL 6.0 (H) 01/31/2024    Lab Results  Component Value Date   TSH 2.410 05/18/2023    Lab Results  Component Value Date   HGBA1C 6.0 (H) 01/31/2024     Assessment:   1. Encounter for well woman exam with routine gynecological exam   2. Cervical cancer screening      Plan:  Pap: Pap, Reflex if ASCUS Mammogram: UTD Colon Screening:  UTD Labs: Done by PCP Routine preventative health maintenance measures emphasized: Exercise/Diet/Weight control, Tobacco Warnings, Alcohol/Substance use risks, Stress Management, Peer Pressure Issues, and Safe Sex Flu vaccine status: UTD COVID Vaccination status: Return to Clinic - 1 Year  Hypoactive Sexual Desire Disorder Reviewed that libido is complex in nature with biological and psychological aspects. We reviewed the role of dopamine in libido. One issue could be the side effects coming from prozac  although I am hesitant to attribute the entire issue to this. She is also having some pain  with sex due to vaginal dryness. We discussed that pain would lower her desire. She has tried [employer]. I recommended she change to uberlube or good clean love brands.  We discussed the increase need for arousal before sex as we age. Reccommended discussion needs with husband.  Discussed that attempting more of self stimulation without a partner involved can sometime increase interest.  We reviewed flibanserin and Bremel as pharmaceutical options. We discussed compounded testosterone cream but discussed the lack of safet data due to lack of research. She would like to try the different lubrication helps with enjoyment and can influence her desire. Will reach out to me if she wants to explore other options.   We did touch on vaginal estrogen or HRT and their role with libido and we could also trial these options as well. They would not be first line for libido alone.    Damien Parsley, CNM [facility]                [1]  Current Outpatient Medications on File Prior to Visit  Medication Sig Dispense Refill   FLUoxetine  (PROZAC ) 40 MG capsule Take 1 capsule (40 mg total) by mouth daily. 90 capsule 3   metFORMIN  (GLUCOPHAGE -XR) 750 MG 24 hr tablet Take 1 tablet (750 mg total) by mouth daily with breakfast. 90 tablet 2   No current facility-administered medications on file prior to visit.  [2] No Known Allergies  "

## 2024-10-10 NOTE — Patient Instructions (Signed)
 How to Do a Breast Self-Exam Preventive Care 56-56 Years Old, Female Preventive care refers to lifestyle choices and visits with your health care provider that can promote health and wellness. Preventive care visits are also called wellness exams. What can I expect for my preventive care visit? Counseling Your health care provider may ask you questions about your: Medical history, including: Past medical problems. Family medical history. Pregnancy history. Current health, including: Menstrual cycle. Method of birth control. Emotional well-being. Home life and relationship well-being. Sexual activity and sexual health. Lifestyle, including: Alcohol, nicotine or tobacco, and drug use. Access to firearms. Diet, exercise, and sleep habits. Work and work astronomer. Sunscreen use. Safety issues such as seatbelt and bike helmet use. Physical exam Your health care provider will check your: Height and weight. These may be used to calculate your BMI (body mass index). BMI is a measurement that tells if you are at a healthy weight. Waist circumference. This measures the distance around your waistline. This measurement also tells if you are at a healthy weight and may help predict your risk of certain diseases, such as type 2 diabetes and high blood pressure. Heart rate and blood pressure. Body temperature. Skin for abnormal spots. What immunizations do I need?  Vaccines are usually given at various ages, according to a schedule. Your health care provider will recommend vaccines for you based on your age, medical history, and lifestyle or other factors, such as travel or where you work. What tests do I need? Screening Your health care provider may recommend screening tests for certain conditions. This may include: Lipid and cholesterol levels. Diabetes screening. This is done by checking your blood sugar (glucose) after you have not eaten for a while (fasting). Pelvic exam and Pap  test. Hepatitis B test. Hepatitis C test. HIV (human immunodeficiency virus) test. STI (sexually transmitted infection) testing, if you are at risk. Lung cancer screening. Colorectal cancer screening. Mammogram. Talk with your health care provider about when you should start having regular mammograms. This may depend on whether you have a family history of breast cancer. BRCA-related cancer screening. This may be done if you have a family history of breast, ovarian, tubal, or peritoneal cancers. Bone density scan. This is done to screen for osteoporosis. Talk with your health care provider about your test results, treatment options, and if necessary, the need for more tests. Follow these instructions at home: Eating and drinking  Eat a diet that includes fresh fruits and vegetables, whole grains, lean protein, and low-fat dairy products. Take vitamin and mineral supplements as recommended by your health care provider. Do not drink alcohol if: Your health care provider tells you not to drink. You are pregnant, may be pregnant, or are planning to become pregnant. If you drink alcohol: Limit how much you have to 0-1 drink a day. Know how much alcohol is in your drink. In the [place] one drink equals one 12 oz bottle of beer (355 mL), one 5 oz glass of wine (148 mL), or one 1 oz glass of hard liquor (44 mL). Lifestyle Brush your teeth every morning and night with fluoride toothpaste. Floss one time each day. Exercise for at least 30 minutes 5 or more days each week. Do not use any products that contain nicotine or tobacco. These products include cigarettes, chewing tobacco, and vaping devices, such as e-cigarettes. If you need help quitting, ask your health care provider. Do not use drugs. If you are sexually active, practice safe sex. Use a condom  or other form of protection to prevent STIs. If you do not wish to become pregnant, use a form of birth control. If you plan to become pregnant,  see your health care provider for a prepregnancy visit. Take aspirin only as told by your health care provider. Make sure that you understand how much to take and what form to take. Work with your health care provider to find out whether it is safe and beneficial for you to take aspirin daily. Find healthy ways to manage stress, such as: Meditation, yoga, or listening to music. Journaling. Talking to a trusted person. Spending time with friends and family. Minimize exposure to UV radiation to reduce your risk of skin cancer. Safety Always wear your seat belt while driving or riding in a vehicle. Do not drive: If you have been drinking alcohol. Do not ride with someone who has been drinking. When you are tired or distracted. While texting. If you have been using any mind-altering substances or drugs. Wear a helmet and other protective equipment during sports activities. If you have firearms in your house, make sure you follow all gun safety procedures. Seek help if you have been physically or sexually abused. What's next? Visit your health care provider once a year for an annual wellness visit. Ask your health care provider how often you should have your eyes and teeth checked. Stay up to date on all vaccines. This information is not intended to replace advice given to you by your health care provider. Make sure you discuss any questions you have with your health care provider. Document Revised: 02/24/2021 Document Reviewed: 02/24/2021 Elsevier Patient Education  2024 Elsevier Inc. Doing breast self-exams can help you stay healthy. They're one way to know what's normal for your breasts. They can help you catch a problem while it's still small and can be treated. You need to: Check your breasts often. Tell your doctor about any changes. You should do breast self-exams even if you have breast implants. What you need: A mirror. A well-lit room. A pillow or other soft object. How to do  a breast self-exam Look for changes  Take off all the clothes above your waist. Stand in front of a mirror in a room with good lighting. Put your hands down at your sides. Compare your breasts in the mirror. Look for difference between them, such as: Differences in shape. Differences in size. Wrinkles, dips, and bumps in one breast and not the other. Look at each breast for skin changes, such as: Redness. Scaly spots. Spots where your skin is thicker. Dimpling. Open sores. Look for changes in your nipples, such as: Fluid coming out of a nipple. Fluid around a nipple. Bleeding. Dimpling. Redness. A nipple that looks pushed in or that has changed position. Feel for changes Lie on your back. Feel each breast. To do this: Pick a breast to feel. Place a pillow under the shoulder closest to that breast. Put the arm closest to that breast behind your head. Feel the breast using the hand of your other arm. Use the pads of your three middle fingers to make small circles starting near the nipple. Use light, medium, and firm pressure. Keep making circles, moving down over the breast. Stop when you feel your ribs. Start making circles with your fingers again, this time going up until you reach your collarbone. Then, make circles out across your breast and into your armpit area. Squeeze your nipple. Check for fluid and lumps. Do these steps again  to check your other breast. Sit or stand in the tub or shower. With soapy water  on your skin, feel each breast the same way you did when you were lying down. Write down what you find Writing down what you find can help you keep track of what you want to tell your doctor. Write down: What's normal for each breast. Any changes you find. Write down: The kind of change. If your breast feels tender or painful. Any lump you find. Write down its size and where it is. When you last had your period. General tips If you're breastfeeding, the best time  to check your breasts is after you feed your baby or after you use a breast pump. If you get a period, the best time to check your breasts is 5-7 days after your period ends. With time, you'll get more used to doing the self-exam. You'll also start to know if there are changes in your breasts. Contact a doctor if: You see a change in the shape or size of your breasts or nipples. You see a change in the skin of your breast or nipples. You have fluid coming from your nipples that isn't normal. You find a new lump or thick area. You have breast pain. You have any concerns about your breast health. This information is not intended to replace advice given to you by your health care provider. Make sure you discuss any questions you have with your health care provider. Document Revised: 11/08/2023 Document Reviewed: 11/08/2023 Elsevier Patient Education  2025 [employer].

## 2024-10-14 LAB — CYTOLOGY - PAP
Comment: NEGATIVE
Diagnosis: NEGATIVE
High risk HPV: NEGATIVE

## 2024-10-16 ENCOUNTER — Ambulatory Visit: Payer: Self-pay | Admitting: Certified Nurse Midwife
